# Patient Record
Sex: Female | Born: 1945 | Race: Black or African American | Hispanic: No | Smoking: Former smoker
Health system: Southern US, Community
[De-identification: ages and names within clinical notes are randomized; demographics above are authoritative.]

## PROBLEM LIST (undated history)

## (undated) DIAGNOSIS — M810 Age-related osteoporosis without current pathological fracture: Secondary | ICD-10-CM

## (undated) DIAGNOSIS — R0609 Other forms of dyspnea: Secondary | ICD-10-CM

## (undated) DIAGNOSIS — R911 Solitary pulmonary nodule: Secondary | ICD-10-CM

## (undated) DIAGNOSIS — J45909 Unspecified asthma, uncomplicated: Secondary | ICD-10-CM

## (undated) DIAGNOSIS — I2510 Atherosclerotic heart disease of native coronary artery without angina pectoris: Secondary | ICD-10-CM

## (undated) DIAGNOSIS — R0600 Dyspnea, unspecified: Secondary | ICD-10-CM

## (undated) DIAGNOSIS — J439 Emphysema, unspecified: Secondary | ICD-10-CM

## (undated) DIAGNOSIS — I739 Peripheral vascular disease, unspecified: Secondary | ICD-10-CM

## (undated) DIAGNOSIS — I10 Essential (primary) hypertension: Secondary | ICD-10-CM

## (undated) HISTORY — DX: Atherosclerotic heart disease of native coronary artery without angina pectoris: I25.10

## (undated) HISTORY — DX: Essential (primary) hypertension: I10

## (undated) HISTORY — DX: Solitary pulmonary nodule: R91.1

## (undated) HISTORY — DX: Age-related osteoporosis without current pathological fracture: M81.0

## (undated) HISTORY — DX: Other forms of dyspnea: R06.09

## (undated) HISTORY — DX: Dyspnea, unspecified: R06.00

## (undated) HISTORY — PX: GASTRIC BYPASS: SHX52

## (undated) HISTORY — DX: Emphysema, unspecified: J43.9

## (undated) HISTORY — DX: Peripheral vascular disease, unspecified: I73.9

---

## 2014-10-18 ENCOUNTER — Other Ambulatory Visit: Payer: Self-pay

## 2014-10-18 ENCOUNTER — Other Ambulatory Visit: Payer: Self-pay | Admitting: Family

## 2014-10-18 DIAGNOSIS — E2839 Other primary ovarian failure: Secondary | ICD-10-CM

## 2014-10-18 DIAGNOSIS — Z1231 Encounter for screening mammogram for malignant neoplasm of breast: Secondary | ICD-10-CM

## 2014-10-21 ENCOUNTER — Ambulatory Visit
Admission: RE | Admit: 2014-10-21 | Discharge: 2014-10-21 | Disposition: A | Discharge status: Home / Self Care | Payer: Medicare Other | Source: Ambulatory Visit | Attending: Family | Admitting: Family

## 2014-10-21 DIAGNOSIS — E2839 Other primary ovarian failure: Secondary | ICD-10-CM

## 2014-10-21 DIAGNOSIS — Z1231 Encounter for screening mammogram for malignant neoplasm of breast: Secondary | ICD-10-CM

## 2015-07-12 DIAGNOSIS — R918 Other nonspecific abnormal finding of lung field: Secondary | ICD-10-CM | POA: Insufficient documentation

## 2015-09-01 ENCOUNTER — Encounter: Payer: Self-pay | Admitting: Pulmonary Disease

## 2015-09-04 ENCOUNTER — Other Ambulatory Visit: Payer: Medicare Other

## 2015-09-04 ENCOUNTER — Ambulatory Visit (INDEPENDENT_AMBULATORY_CARE_PROVIDER_SITE_OTHER): Payer: Medicare Other | Admitting: Pulmonary Disease

## 2015-09-04 ENCOUNTER — Encounter: Payer: Self-pay | Admitting: Pulmonary Disease

## 2015-09-04 VITALS — BP 138/86 | HR 70 | Ht 61.5 in | Wt 191.0 lb

## 2015-09-04 DIAGNOSIS — R918 Other nonspecific abnormal finding of lung field: Secondary | ICD-10-CM | POA: Diagnosis not present

## 2015-09-04 DIAGNOSIS — M810 Age-related osteoporosis without current pathological fracture: Secondary | ICD-10-CM | POA: Insufficient documentation

## 2015-09-04 DIAGNOSIS — J449 Chronic obstructive pulmonary disease, unspecified: Secondary | ICD-10-CM

## 2015-09-04 DIAGNOSIS — J432 Centrilobular emphysema: Secondary | ICD-10-CM

## 2015-09-04 DIAGNOSIS — I10 Essential (primary) hypertension: Secondary | ICD-10-CM | POA: Insufficient documentation

## 2015-09-04 NOTE — Patient Instructions (Addendum)
   Continue taking your Anoro inhaler and albuterol inhaler as prescribed.  Please get a copy of your chest CT from 07/12/15 on a CD so that I can review the images myself.  We will repeat your CT scan in June to make sure the spots in your lungs are not getting bigger.  We will do a breathing test at your next appointment in June and review your CT scan at that time.  TESTS ORDERED: 1. CT chest without contrast June 2017 2. Full pulmonary function testing at follow-up appointment 3. Alpha-1 antitrypsin phenotype today central obesity

## 2015-09-04 NOTE — Progress Notes (Signed)
Subjective:    Patient ID: Carla DakinsClaudia Knights, female    DOB: 01/26/1946, 70 y.o.   MRN: [id number]  HPI She was [facility] 04[facility]/15, Urgent Care 06/09/15, & [facility] by [facility] on 2/28 for ongoing problems with "Acute Bronchitis". She was treated with antibiotics previously. She reports initially she had no fever, chills, or sweats. Initially she also had some sinus congestion initially with mild drainage. She reports this has resolved. She reports her cough has largely resolved. She does have wheezing with walking & exertion. She does have dyspnea on exertion but none at rest. She denies any chest tightness or pressure. No adenopathy in her neck, groin, or axilla. No reflux, dyspepsia, or morning brash water taste. Previously patient was on Breo and was recently switched to Anoro. She reports on Anoro she hasn't had to use her rescue inhaler as frequently.   Review of Systems Denies any rashes or easy bruising. She reports some mild joint pain but denies any stiffness, erythema, or swelling. A pertinent 14 point review of systems is negative except as per the history of presenting illness.  No Known Allergies  No current outpatient prescriptions on file prior to visit.   No current facility-administered medications on file prior to visit.    Past Medical History  Diagnosis Date  . Hypertension   . Osteoporosis   . Emphysema of lung [facility](HCC)     Past Surgical History  Procedure Laterality Date  . Gastric bypass      Family History  Problem Relation Age of Onset  . Cancer Mother     leukemia  . Heart attack Father   . Hyperlipidemia Father   . Alcohol abuse Son   . Cancer Paternal Aunt   . Asthma Grandchild     Social History   Social History  . Marital Status: Single    Spouse Name: N/A  . Number of Children: N/A  . Years of Education: N/A   Occupational History  . Retired - currently Regulatory affairs officersubstitue     Teacher Assistant   Social History Main Topics   . Smoking status: Former Smoker -- 0.50 packs/day for 45 years    Types: Cigarettes    Start date: 06/28/1960    Quit date: 04/29/2005  . Smokeless tobacco: Never Used  . Alcohol Use: 0.0 oz/week    0 Standard drinks or equivalent per week     Comment: beer - daily   . Drug Use: No  . Sexual Activity: Not Asked   Other Topics Concern  . None   Social History Narrative   Originally from [place], [place]. Moved to [place] in 2013. 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 No pets currently. No bird exposure. Mold in her current home was treated. Previously enjoyed Research scientist (life sciences)playing bingo.       Objective:   Physical Exam BP 138/86 mmHg  Pulse 70  Ht 5' 1.5" (1.562 m)  Wt 191 lb (86.637 kg)  BMI 35.51 kg/m2  SpO2 97% General:  Awake. Alert. No acute distress. Mild central obesity.  Integument:  Warm & dry. No rash on exposed skin. No bruising. Lymphatics:  No appreciated cervical or supraclavicular lymphadenoapthy. HEENT:  Moist mucus membranes. No oral ulcers. No scleral injection or icterus. Poor dentition. Cardiovascular:  Regular rate. No edema. No appreciable JVD.  Pulmonary:  Good aeration & clear to auscultation bilaterally. Symmetric chest wall expansion. No accessory  muscle use. Abdomen: Soft. Normal bowel sounds. Protuberant. Grossly nontender. Musculoskeletal:  Normal bulk and tone. Hand grip strength 5/5 bilaterally. No joint deformity or effusion appreciated. Neurological:  CN 2-12 grossly in tact. No meningismus. Moving all 4 extremities equally. Symmetric brachioradialis deep tendon reflexes. Psychiatric:  Mood and affect congruent. Speech normal rhythm, rate & tone.   IMAGING CT CHEST W/ 07/12/15 (per radiologist): No axillary, mediastinal, or hilar lymph nodes are enlarged. Heart normal in size. No pleural or pericardial effusion. Bronchial wall thickening of the major airways and cylindrical  bronchiectasis in the lower lobes greater than right middle lobe & lingula. Mild central lobular emphysema. Mild scarring and/or subsegmental atelectasis noted in right middle lobe, lingula, and basilar left lower lobe. Small areas of peripheral nodularity left upper & left lower lobes with tree-in-bud configuration. 9 mm subpleural nodule medial basal right lower lobe. 10 mm nodule superiorly right lower lobe.    Assessment & Plan:  70 year old African female with long-standing history of tobacco use and previously diagnosed with COPD. Her chest CT imaging per radiologist shows bilateral lung nodules with the largest measuring 1 cm in her right lower lobe. Patient also has findings consistent with an suggestive of bronchiectasis. Given her bronchitis and productive cough at the time of imaging I do question whether or not these findings could represent an infectious process. Her cough has since resolved. Certainly an underlying malignancy must be considered and I did communicate this to the patient. I instructed the patient to contact my office if she had any new breathing problems before her next appointment or questions.  1. Bilateral lung nodules: Requested patient obtain chest CT imaging from March on a disc for my review. Repeat CT chest without contrast June 2017. 2. COPD with centrilobular emphysema: Patient continuing on Anoro. Screening for alpha-1 antitrypsin deficiency. Checking full pulmonary function testing at follow-up appointment. 3. Follow-up: Patient to return to clinic in June 2017 or sooner if needed.  Donna Christen Jamison Neighbor, M.D. [facility] Pulmonary & Critical Care Pager:  [phone] After 3pm or if no response, call [phone] 4:17 PM 09/04/2015

## 2015-09-08 LAB — ALPHA-1 ANTITRYPSIN PHENOTYPE: A-1 Antitrypsin: 99 mg/dL (ref 83–199)

## 2015-10-10 ENCOUNTER — Ambulatory Visit (INDEPENDENT_AMBULATORY_CARE_PROVIDER_SITE_OTHER)
Admission: RE | Admit: 2015-10-10 | Discharge: 2015-10-10 | Disposition: A | Discharge status: Home / Self Care | Payer: Medicare Other | Source: Ambulatory Visit | Attending: Pulmonary Disease | Admitting: Pulmonary Disease

## 2015-10-10 DIAGNOSIS — J432 Centrilobular emphysema: Secondary | ICD-10-CM

## 2015-10-10 DIAGNOSIS — J449 Chronic obstructive pulmonary disease, unspecified: Secondary | ICD-10-CM | POA: Diagnosis not present

## 2015-10-10 DIAGNOSIS — R918 Other nonspecific abnormal finding of lung field: Secondary | ICD-10-CM | POA: Diagnosis not present

## 2015-10-16 ENCOUNTER — Encounter: Payer: Self-pay | Admitting: Pulmonary Disease

## 2015-10-16 ENCOUNTER — Ambulatory Visit (HOSPITAL_COMMUNITY)
Admission: RE | Admit: 2015-10-16 | Discharge: 2015-10-16 | Disposition: A | Discharge status: Home / Self Care | Payer: Medicare Other | Source: Ambulatory Visit | Attending: Pulmonary Disease | Admitting: Pulmonary Disease

## 2015-10-16 ENCOUNTER — Ambulatory Visit (INDEPENDENT_AMBULATORY_CARE_PROVIDER_SITE_OTHER): Payer: Medicare Other | Admitting: Pulmonary Disease

## 2015-10-16 ENCOUNTER — Other Ambulatory Visit: Payer: Medicare Other

## 2015-10-16 VITALS — BP 128/82 | HR 65 | Ht 62.0 in | Wt 186.0 lb

## 2015-10-16 DIAGNOSIS — J432 Centrilobular emphysema: Secondary | ICD-10-CM | POA: Diagnosis not present

## 2015-10-16 DIAGNOSIS — J449 Chronic obstructive pulmonary disease, unspecified: Secondary | ICD-10-CM | POA: Insufficient documentation

## 2015-10-16 DIAGNOSIS — R911 Solitary pulmonary nodule: Secondary | ICD-10-CM

## 2015-10-16 DIAGNOSIS — IMO0001 Reserved for inherently not codable concepts without codable children: Secondary | ICD-10-CM

## 2015-10-16 DIAGNOSIS — R918 Other nonspecific abnormal finding of lung field: Secondary | ICD-10-CM | POA: Diagnosis present

## 2015-10-16 LAB — PULMONARY FUNCTION TEST
DL/VA % pred: 103 %
DL/VA: 4.72 ml/min/mmHg/L
DLCO UNC % PRED: 67 %
DLCO unc: 14.45 ml/min/mmHg
FEF 25-75 PRE: 0.34 L/s
FEF 25-75 Post: 0.56 L/sec
FEF2575-%CHANGE-POST: 62 %
FEF2575-%Pred-Post: 37 %
FEF2575-%Pred-Pre: 23 %
FEV1-%CHANGE-POST: 25 %
FEV1-%PRED-POST: 64 %
FEV1-%PRED-PRE: 50 %
FEV1-POST: 1.03 L
FEV1-PRE: 0.82 L
FEV1FVC-%CHANGE-POST: 4 %
FEV1FVC-%Pred-Pre: 66 %
FEV6-%CHANGE-POST: 16 %
FEV6-%PRED-PRE: 76 %
FEV6-%Pred-Post: 89 %
FEV6-PRE: 1.52 L
FEV6-Post: 1.77 L
FEV6FVC-%Change-Post: -3 %
FEV6FVC-%PRED-PRE: 99 %
FEV6FVC-%Pred-Post: 96 %
FVC-%CHANGE-POST: 20 %
FVC-%PRED-POST: 92 %
FVC-%Pred-Pre: 76 %
FVC-PRE: 1.59 L
FVC-Post: 1.92 L
POST FEV1/FVC RATIO: 54 %
POST FEV6/FVC RATIO: 93 %
PRE FEV6/FVC RATIO: 95 %
Pre FEV1/FVC ratio: 51 %
RV % PRED: 135 %
RV: 2.88 L
TLC % pred: 102 %
TLC: 4.87 L

## 2015-10-16 MED ORDER — ALBUTEROL SULFATE (2.5 MG/3ML) 0.083% IN NEBU
2.5000 mg | INHALATION_SOLUTION | Freq: Once | RESPIRATORY_TRACT | Status: AC
  Administered 2015-10-16: 2.5 mg via RESPIRATORY_TRACT

## 2015-10-16 NOTE — Progress Notes (Signed)
Subjective:    Patient ID: Carla Russell, female    DOB: 1945/09/03, 70 y.o.   MRN: [id number]  C.C.:  Follow-up for Bilateral Lung Nodules and Moderate-Severe COPD w/ Emphysema.  HPI Bilateral Lung Nodules:  Dominant nodule in right lower lobe is 1.1cm at maximal dimension. First seen on CT March 2017.  Moderate-Severe COPD w/ Emphysema:  Currently prescribed Anoro. She reports she has taken her Anoro nearly every day. She uses her rescue inhaler 2-3 times monthly. No exacerbations since last appointment. She reports baseline dyspnea. Reports intermittent nonproductive cough. No wheezing.   Review of Systems No chest pain or pressure. No fever, chills, or sweats. No headaches. Does feel unsteady on her feet. No focal weakness, numbness or tingling.   No Known Allergies  Current Outpatient Prescriptions on File Prior to Visit  Medication Sig Dispense Refill  . albuterol (PROVENTIL HFA;VENTOLIN HFA) 108 (90 Base) MCG/ACT inhaler Inhale 2 puffs into the lungs every 4 (four) hours as needed.    Marland Kitchen albuterol (PROVENTIL) (2.5 MG/3ML) 0.083% nebulizer solution Inhale 2.5 mg into the lungs every 6 (six) hours as needed.    Marland Kitchen alendronate (FOSAMAX) 70 MG tablet Take 70 mg by mouth every 7 (seven) days.    . Multiple Vitamin (MULTIVITAMIN) tablet Take 1 tablet by mouth daily.    Marland Kitchen umeclidinium-vilanterol (ANORO ELLIPTA) 62.5-25 MCG/INH AEPB Inhale 1 puff into the lungs daily.     No current facility-administered medications on file prior to visit.    Past Medical History  Diagnosis Date  . Hypertension   . Osteoporosis   . Emphysema of lung [facility])     Past Surgical History  Procedure Laterality Date  . Gastric bypass      Family History  Problem Relation Age of Onset  . Cancer Mother     leukemia  . Heart attack Father   . Hyperlipidemia Father   . Alcohol abuse Son   . Cancer Paternal Aunt   . Asthma Grandchild     Social History   Social History  . Marital Status: Single    Spouse Name: N/A  . Number of Children: N/A  . Years of Education: N/A   Occupational History  . Retired - currently Regulatory affairs officer   Social History Main Topics  . Smoking status: Former Smoker -- 0.50 packs/day for 45 years    Types: Cigarettes    Start date: 06/28/1960    Quit date: 04/29/2005  . Smokeless tobacco: Never Used  . Alcohol Use: 0.0 oz/week    0 Standard drinks or equivalent per week     Comment: beer - daily   . Drug Use: No  . Sexual Activity: Not Asked   Other Topics Concern  . None   Social History Narrative   Originally from [place], [place]. Moved to [place] in 2013. 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 No pets currently. No bird exposure. Mold in her current home was treated. Previously enjoyed Research scientist (life sciences).       Objective:   Physical Exam BP 128/82 mmHg  Pulse 65  Ht  (1.575 m)  Wt 186 lb (84.369 kg)  BMI 34.01 kg/m2  SpO2 96% General:  Awake. Alert. No distress.   Integument:  Warm & dry. No rash on exposed skin. No bruising. Lymphatics:  No appreciated cervical or supraclavicular lymphadenoapthy. HEENT:  Moist mucus membranes. Poor dentition. No scleral  icterus. Cardiovascular:  Regular rate. No edema. Normal S1 & S2.  Pulmonary:  Clear bilaterally to auscultation. Normal work of breathing on room air. Speaking in complete sentences.  PFT 10/16/15: FVC 1.59 L (76%) FEV1 0.18 L (80%) FEV1/FVC 0.51 FEF 25-75 0.34 L (23%) positive bronchodilator response TLC 4.87 L (102%) RV 135% DLCO uncorrected 67%  IMAGING CT CHEST W/O 10/10/15 (personally reviewed by me): No pathologic mediastinal adenopathy. No pericardial effusion. No pleural effusion or thickening. 1.1 x 0.9 cm right lower lobe nodule relatively unchanged. No other new or developing nodules appreciated. The goal predominant emphysematous changes. Suggestion of bronchiectasis in lower  lobes.  CT CHEST W/ 07/12/15 (reviewed by me): No axillary, mediastinal, or hilar lymph nodes are enlarged. Heart normal in size. No pleural or pericardial effusion. Bronchial wall thickening of the major airways and cylindrical bronchiectasis in the lower lobes greater than right middle lobe & lingula. Mild central lobular emphysema. Mild scarring and/or subsegmental atelectasis noted in right middle lobe, lingula, and basilar left lower lobe. Small areas of peripheral nodularity left upper & left lower lobes with tree-in-bud configuration. 9 mm subpleural nodule medial basal right lower lobe. 10 mm nodule superiorly right lower lobe.  LABS 09/04/15 Alpha-1 antitrypsin: MM (99)    Assessment & Plan:  70 year old African female with underlying moderate-severe COPD with centrilobular emphysema. Suspect patient COPD is secondary to her prior tobacco use given negative alpha-1 antitrypsin. I do believe that she would benefit from daily use of her inhaled medications and did counsel her as such. I will hold off initiating any new inhaler medications at this time pending repeat pulmonary function testing at next appointment. We did spend a significant amount time today discussing her CT scan and comparing it to her previous CT scan. The nodule has not changed appreciably in size but given the potential risk of malignancy I feel that PET/CT imaging is reasonable. With resolution of previous nodules noted on imaging I do question whether or not this could represent an inflammatory/autoimmune process. I am performing a serum and urine workup for possible infectious and inflammatory etiologies. Instructed the patient to contact my office if she had any new breathing problems before next appointment.  1. Bilateral Lung Nodules/Right Lower Lobe Nodule: Checking PET CT scan. Checking serum ANCA panel, ANA with reflex to comprehensive panel, anti-CCP, SSA, & SSB. 2. Moderate-Severe COPD w/ Emphysema: Continuing patient  on Anoro. Repeat spirometry with bronchodilator challenge and next appointment to ensure maximal bronchodilatation. 6 minute walk test at next appointment. 3. Immunizations: Influenza vaccine October 2016. Patient to determine date of last Pneumovax with her primary care physician's office. 4. Follow-up: Patient to return to clinic in 3 months or sooner if needed.  Donna ChristenJennings E. Jamison NeighborNestor, M.D. [facility] Pulmonary & Critical Care Pager:  [phone] After 3pm or if no response, call [phone] 4:46 PM 10/16/2015

## 2015-10-16 NOTE — Patient Instructions (Addendum)
   We will set you up for a PET CT scan and decide on biopsy after that result is back.  Remember to take your Anoro daily.  Call me if you have any new breathing problems before your next appointment.  I will see you back in 3 months with a breathing test at that time.  TESTS ORDERED: 1. PET CT scan 2. Spirometry with bronchodilator challenge and next appointment 3. 6 minute walk test on room air at next appointment 4. Serum ANCA Panel, ANA with reflex to comprehensive panel, Anti-CCP, SSA, & SSB.  5. Urine Histoplasma & Blastomyces Antigens

## 2015-10-17 LAB — ANTINUCLEAR ANTIBODIES, IFA: ANA TITER 1: NEGATIVE

## 2015-10-17 LAB — SJOGREN'S SYNDROME ANTIBODS(SSA + SSB)
SSA (Ro) (ENA) Antibody, IgG: <1
SSB (LA) (ENA) ANTIBODY, IGG: NEGATIVE

## 2015-10-18 LAB — ANCA SCREEN W REFLEX TITER: ANCA SCREEN: POSITIVE — AB

## 2015-10-18 LAB — RFLX P-ANCA TITER

## 2015-10-18 LAB — CYCLIC CITRUL PEPTIDE ANTIBODY, IGG

## 2015-10-20 LAB — HISTOPLASMA ANTIGEN, URINE

## 2015-10-20 LAB — MVISTA BLASTOMYCES QNT AG, URINE

## 2015-10-24 ENCOUNTER — Ambulatory Visit (HOSPITAL_COMMUNITY)
Admission: RE | Admit: 2015-10-24 | Discharge: 2015-10-24 | Disposition: A | Discharge status: Home / Self Care | Payer: Medicare Other | Source: Ambulatory Visit | Attending: Pulmonary Disease | Admitting: Pulmonary Disease

## 2015-10-24 DIAGNOSIS — R911 Solitary pulmonary nodule: Secondary | ICD-10-CM | POA: Insufficient documentation

## 2015-10-24 DIAGNOSIS — I700 Atherosclerosis of aorta: Secondary | ICD-10-CM | POA: Diagnosis not present

## 2015-10-24 DIAGNOSIS — IMO0001 Reserved for inherently not codable concepts without codable children: Secondary | ICD-10-CM

## 2015-10-24 DIAGNOSIS — I2510 Atherosclerotic heart disease of native coronary artery without angina pectoris: Secondary | ICD-10-CM | POA: Insufficient documentation

## 2015-10-24 LAB — GLUCOSE, CAPILLARY: Glucose-Capillary: 94 mg/dL (ref 65–99)

## 2015-10-24 MED ORDER — FLUDEOXYGLUCOSE F - 18 (FDG) INJECTION: 9.4000 | Freq: Once | INTRAVENOUS | Status: DC | PRN

## 2015-10-25 ENCOUNTER — Telehealth: Payer: Self-pay | Admitting: *Deleted

## 2015-10-25 DIAGNOSIS — R918 Other nonspecific abnormal finding of lung field: Secondary | ICD-10-CM

## 2015-10-25 NOTE — Telephone Encounter (Signed)
CT ordered. Patient aware. Nothing further needed.

## 2015-10-25 NOTE — Telephone Encounter (Signed)
-----   Message from Roslynn AmbleJennings E Nestor, MD sent at 10/24/2015  6:29 PM EDT ----- please call the patient and let her know that her PET CT scan did not show any hypermetabolic areas that would suggest malignancy.  Particularly the nodule in her right lower lobe was not active. This does not completely rule out the possibility of cancer but is reassuring. We will plan to repeat her chest CT scan without contrast in September 2017 to continue to follow and make sure there is no growth. Please order this. Thank you.

## 2015-10-26 ENCOUNTER — Other Ambulatory Visit: Payer: Self-pay | Admitting: Family

## 2015-10-26 DIAGNOSIS — Z1231 Encounter for screening mammogram for malignant neoplasm of breast: Secondary | ICD-10-CM

## 2015-10-30 ENCOUNTER — Ambulatory Visit
Admission: RE | Admit: 2015-10-30 | Discharge: 2015-10-30 | Disposition: A | Discharge status: Home / Self Care | Payer: Medicare Other | Source: Ambulatory Visit | Attending: Family | Admitting: Family

## 2015-10-30 DIAGNOSIS — Z1231 Encounter for screening mammogram for malignant neoplasm of breast: Secondary | ICD-10-CM

## 2015-11-03 ENCOUNTER — Telehealth: Payer: Self-pay | Admitting: Pulmonary Disease

## 2015-11-03 NOTE — Telephone Encounter (Signed)
Result Notes     Notes Recorded by Karleen HampshireMichelle P Gay, CMA on 10/25/2015 at 4:27 PM Patient notified. CT ordered prior to 9/20 appointment in September. Nothing further needed. ------ Notes Recorded by Roslynn AmbleJennings E Nestor, MD on 10/24/2015 at 6:29 PM please call the patient and let her know that her PET CT scan did not show any hypermetabolic areas that would suggest malignancy. Particularly the nodule in her right lower lobe was not active. This does not completely rule out the possibility of cancer but is reassuring. We will plan to repeat her chest CT scan without contrast in September 2017 to continue to follow and make sure there is no growth. Please order this. Thank you.   Spoke with pt. She did not remember talking to Littleton CommonMichelle on 10/25/15. I have gone over her PET scan results with her again. All of her questions have been answered.

## 2016-01-10 ENCOUNTER — Inpatient Hospital Stay: Admission: RE | Admit: 2016-01-10 | Payer: Medicare Other | Source: Ambulatory Visit

## 2016-01-16 ENCOUNTER — Ambulatory Visit: Payer: BLUE CROSS/BLUE SHIELD | Admitting: Pulmonary Disease

## 2016-01-16 ENCOUNTER — Ambulatory Visit: Payer: BLUE CROSS/BLUE SHIELD

## 2016-01-17 ENCOUNTER — Ambulatory Visit (INDEPENDENT_AMBULATORY_CARE_PROVIDER_SITE_OTHER): Payer: Medicare Other | Admitting: Pulmonary Disease

## 2016-01-17 ENCOUNTER — Other Ambulatory Visit: Payer: Self-pay | Admitting: Pulmonary Disease

## 2016-01-17 ENCOUNTER — Encounter: Payer: Self-pay | Admitting: Pulmonary Disease

## 2016-01-17 VITALS — BP 140/82 | HR 75 | Ht 62.0 in | Wt 192.0 lb

## 2016-01-17 DIAGNOSIS — R911 Solitary pulmonary nodule: Secondary | ICD-10-CM

## 2016-01-17 DIAGNOSIS — J449 Chronic obstructive pulmonary disease, unspecified: Secondary | ICD-10-CM

## 2016-01-17 DIAGNOSIS — Z23 Encounter for immunization: Secondary | ICD-10-CM | POA: Diagnosis not present

## 2016-01-17 DIAGNOSIS — IMO0001 Reserved for inherently not codable concepts without codable children: Secondary | ICD-10-CM

## 2016-01-17 DIAGNOSIS — J432 Centrilobular emphysema: Secondary | ICD-10-CM

## 2016-01-17 LAB — PULMONARY FUNCTION TEST
FEF 25-75 Post: 0.4 L/sec
FEF 25-75 Pre: 0.37 L/sec
FEF2575-%CHANGE-POST: 8 %
FEF2575-%Pred-Post: 27 %
FEF2575-%Pred-Pre: 25 %
FEV1-%Change-Post: 3 %
FEV1-%Pred-Post: 65 %
FEV1-%Pred-Pre: 63 %
FEV1-PRE: 1.01 L
FEV1-Post: 1.05 L
FEV1FVC-%CHANGE-POST: 4 %
FEV1FVC-%Pred-Pre: 68 %
FEV6-%Change-Post: 1 %
FEV6-%PRED-PRE: 90 %
FEV6-%Pred-Post: 92 %
FEV6-PRE: 1.78 L
FEV6-Post: 1.81 L
FEV6FVC-%Change-Post: 2 %
FEV6FVC-%PRED-PRE: 96 %
FEV6FVC-%Pred-Post: 99 %
FVC-%CHANGE-POST: 0 %
FVC-%PRED-PRE: 93 %
FVC-%Pred-Post: 92 %
FVC-POST: 1.9 L
FVC-PRE: 1.92 L
POST FEV6/FVC RATIO: 95 %
PRE FEV6/FVC RATIO: 93 %
Post FEV1/FVC ratio: 55 %
Pre FEV1/FVC ratio: 53 %

## 2016-01-17 MED ORDER — ALBUTEROL SULFATE HFA 108 (90 BASE) MCG/ACT IN AERS: 2.0000 | INHALATION_SPRAY | RESPIRATORY_TRACT | 3 refills | Status: DC | PRN

## 2016-01-17 MED ORDER — UMECLIDINIUM-VILANTEROL 62.5-25 MCG/INH IN AEPB: 1.0000 | INHALATION_SPRAY | Freq: Every day | RESPIRATORY_TRACT | 3 refills | Status: DC

## 2016-01-17 NOTE — Progress Notes (Signed)
Test reviewed.  

## 2016-01-17 NOTE — Patient Instructions (Addendum)
   Continue using your inhalers as prescribed.  Call me if you have any new breathing problems before your next appointment.  I will see you back in 6 months or sooner if needed.   TESTS ORDERED: 1. CT Chest w/o Contrast this month (September) - [place]ers [address]

## 2016-01-17 NOTE — Progress Notes (Signed)
Subjective:    Patient ID: Carla Russell, female    DOB: 03/28/1946, 70 y.o.   MRN: [id number]  C.C.:  Follow-up for Bilateral Lung Nodules/Right Lower Lobe Nodule and Moderate-Severe COPD w/ Emphysema.  HPI Bilateral Lung Nodules/Right Lower Lobe Nodule:  Dominant nodule in right lower lobe is 1.1cm at maximal dimension. First seen on CT March 2017. Lung nodule was not hypermetabolic on PET CT imaging in June. Patient was supposed to have repeat CT imaging this month but declined.  Moderate-Severe COPD w/ Emphysema:  On Anoro. She reports she is using her rescue inhaler 3-4 times per month. Minimal coughing or wheezing. She reports no worsening in her baseline dyspnea. No exacerbations since last appointment.   Review of Systems No chest pain, tightness, or pressure. No fever, chills, or sweats. No nausea, emesis, or diarrhea.   No Known Allergies  Current Outpatient Prescriptions on File Prior to Visit  Medication Sig Dispense Refill  . albuterol (PROVENTIL HFA;VENTOLIN HFA) 108 (90 Base) MCG/ACT inhaler Inhale 2 puffs into the lungs every 4 (four) hours as needed.    Marland Kitchen. albuterol (PROVENTIL) (2.5 MG/3ML) 0.083% nebulizer solution Inhale 2.5 mg into the lungs every 6 (six) hours as needed.    Marland Kitchen. alendronate (FOSAMAX) 70 MG tablet Take 70 mg by mouth every 7 (seven) days.    . Multiple Vitamin (MULTIVITAMIN) tablet Take 1 tablet by mouth daily.    Marland Kitchen. umeclidinium-vilanterol (ANORO ELLIPTA) 62.5-25 MCG/INH AEPB Inhale 1 puff into the lungs daily.     No current facility-administered medications on file prior to visit.     Past Medical History:  Diagnosis Date  . Emphysema of lung (HCC)   . Hypertension   . Osteoporosis     Past Surgical History:  Procedure Laterality Date  . GASTRIC BYPASS      Family History  Problem Relation Age of Onset  . Cancer Mother     leukemia  . Heart attack Father   . Hyperlipidemia Father   . Alcohol abuse Son   . Cancer Paternal Aunt   .  Asthma Grandchild     Social History   Social History  . Marital status: Single    Spouse name: N/A  . Number of children: N/A  . Years of education: N/A   Occupational History  . Retired - currently Regulatory affairs officersubstitue     Teacher Assistant   Social History Main Topics  . Smoking status: Former Smoker    Packs/day: 0.50    Years: 45.00    Types: Cigarettes    Start date: 06/28/1960    Quit date: 04/29/2005  . Smokeless tobacco: Never Used  . Alcohol use 0.0 oz/week     Comment: beer - daily   . Drug use: No  . Sexual activity: Not Asked   Other Topics Concern  . None   Social History Narrative   Originally from [place], [place]. Moved to [place] in 2013. 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 No pets currently. No bird exposure. Mold in her current home was treated. Previously enjoyed Research scientist (life sciences)playing bingo.       Objective:   Physical Exam BP 140/82 (BP Location: Left Arm, Cuff Size: Normal)   Pulse 75   Ht 5\' 2"  (1.575 m)   Wt 192 lb (87.1 kg)   SpO2 95%   BMI 35.12 kg/m  General:  Awake. Alert. No distress. Comfortable.  Integument:  Warm & dry.  No rash on exposed skin. No bruising. Lymphatics:  No appreciated cervical or supraclavicular lymphadenoapthy. HEENT:  Moist mucus membranes. Poor dentition. No scleral icterus. No nasal turbinate swelling. Cardiovascular:  Regular rate & rhythm. No edema. Normal S1 & S2.  Pulmonary:  Good aeration bilaterally. No accessory muscle use on room air. Clear to auscultation.  PFT 01/17/16: FVC 1.92 L (93%) FEV1 1.01 L (63%) FEV1/FVC 0.53 FEF 25-75 0.37 L (25%) negative bronchodilator response 10/16/15: FVC 1.59 L (76%) FEV1 0.82 L (50%) FEV1/FVC 0.51 FEF 25-75 0.34 L (23%) positive bronchodilator response TLC 4.87 L (102%) RV 135% DLCO uncorrected 67%  01/17/16:  Walked 336 meters / Baseline Sat 100% on RA / Nadir Sat 91% on RA  IMAGING PET CT 10/24/15  (previously reviewed by me): No abnormal uptake within 1 cm right lower lobe nodule. No hypermetabolic areas elsewhere in the body either.  CT CHEST W/O 10/10/15 (previously reviewed by me): No pathologic mediastinal adenopathy. No pericardial effusion. No pleural effusion or thickening. 1.1 x 0.9 cm right lower lobe nodule relatively unchanged. No other new or developing nodules appreciated. The goal predominant emphysematous changes. Suggestion of bronchiectasis in lower lobes.  CT CHEST W/ 07/12/15 (previously reviewed by me): No axillary, mediastinal, or hilar lymph nodes are enlarged. Heart normal in size. No pleural or pericardial effusion. Bronchial wall thickening of the major airways and cylindrical bronchiectasis in the lower lobes greater than right middle lobe & lingula. Mild central lobular emphysema. Mild scarring and/or subsegmental atelectasis noted in right middle lobe, lingula, and basilar left lower lobe. Small areas of peripheral nodularity left upper & left lower lobes with tree-in-bud configuration. 9 mm subpleural nodule medial basal right lower lobe. 10 mm nodule superiorly right lower lobe.  LABS 10/16/15 ANA:  Negative Anti-CCP:  <16 SSA:  <1.0 SSB:  <1.0 Urine Histoplasma Antigen:  <0.5 Urine Blastomyces Antigen:  Negative  P-ANCA:  1:40  09/04/15 Alpha-1 antitrypsin: MM (99)    Assessment & Plan:  70 y.o. African female with underlying moderate-severe COPD with centrilobular emphysema and bilateral lung nodules as well as a dominant right lower lobe nodule. Right lower lobe nodule was not hypermetabolic on PET/CT imaging and I reviewed her serum lab tests with her from last appointment. Her spirometry today has significantly improved since previous testing and demonstrates no significant bronchodilator response. I did review her 6 minute walk test which shows a significant desaturation but not enough to require oxygen therapy. I instructed the patient to contact my office  if she had any new breathing problems or questions before her next appointment.  1. Bilateral Lung Nodules/Right Lower Lobe Nodule:  Repeat CT scan this month. If stable plan for repeat CT scan in 3 months. 2. Moderate-Severe COPD w/ Emphysema:  Continuing Anoro. Refills sent today. 3. Health Maintenance:  S/P Prevnar 11 June 2013 & Pneumovax 19 November 2011.  4. Follow-up: Patient to return to clinic in 6 months or sooner if needed.   Donna Christen Jamison Neighbor, M.D. [facility] Pulmonary & Critical Care Pager:  [phone] After 3pm or if no response, call [phone] 12:06 PM 01/17/16

## 2016-01-17 NOTE — Addendum Note (Signed)
Addended by: Velvet BatheAULFIELD, Joene Gelder L on: 01/17/2016 12:33 PM   Modules accepted: Orders

## 2016-01-25 ENCOUNTER — Ambulatory Visit (INDEPENDENT_AMBULATORY_CARE_PROVIDER_SITE_OTHER)
Admission: RE | Admit: 2016-01-25 | Discharge: 2016-01-25 | Disposition: A | Discharge status: Home / Self Care | Payer: Medicare Other | Source: Ambulatory Visit | Attending: Pulmonary Disease | Admitting: Pulmonary Disease

## 2016-01-25 DIAGNOSIS — R911 Solitary pulmonary nodule: Secondary | ICD-10-CM | POA: Diagnosis not present

## 2016-01-25 DIAGNOSIS — IMO0001 Reserved for inherently not codable concepts without codable children: Secondary | ICD-10-CM

## 2016-02-01 ENCOUNTER — Telehealth: Payer: Self-pay | Admitting: Pulmonary Disease

## 2016-02-01 DIAGNOSIS — R918 Other nonspecific abnormal finding of lung field: Secondary | ICD-10-CM

## 2016-02-01 NOTE — Telephone Encounter (Signed)
Notes Recorded by Roslynn AmbleJennings E Nestor, MD on 01/30/2016 at 6:27 PM EDT Please let the patient know I reviewed her CT scan. The nodule in her right lower lung hasn't changed in size when compared with her CT scan in March. I feel it is reasonable to repeat her CT scan in March 2018 to continue to monitor it for signs of progression/enlargement. I would not wait any longer than 6 months and at the earliest would do a repeat scan at 3 months, but I think 6 months should be sufficient. Please order a CT scan of her chest without contrast for March 2018. Thanks.  Pt aware CT results. Pt voiced understanding. Order placed for CT. Nothing further needed.

## 2016-06-10 ENCOUNTER — Telehealth: Payer: Self-pay | Admitting: Pulmonary Disease

## 2016-06-10 MED ORDER — UMECLIDINIUM-VILANTEROL 62.5-25 MCG/INH IN AEPB: 1.0000 | INHALATION_SPRAY | Freq: Every day | RESPIRATORY_TRACT | 3 refills | Status: DC

## 2016-06-10 NOTE — Telephone Encounter (Signed)
Rx sent to preferred pharmacy. Pt aware & voiced her understanding.  Nothing further needed.  

## 2016-07-17 ENCOUNTER — Telehealth: Payer: Self-pay | Admitting: Pulmonary Disease

## 2016-07-17 NOTE — Telephone Encounter (Signed)
The patient's CT scan isn't until 3/28. Once it's available I will review it. J.

## 2016-07-17 NOTE — Telephone Encounter (Signed)
Pt aware that once she has her CT scan we will call her with the results of this and she will not have wait until her May appt. Nothing further needed.

## 2016-07-17 NOTE — Telephone Encounter (Signed)
Called and spoke to pt. Pt states we are needing to reschedule 08/06/16 d/t JN needing to change office hours. Pt was rescheduled to 08/2016. Pt is requesting her CT chest from 3/28 to be read and resulted prior to May appt.   Dr. Jamison NeighborNestor please advise on CT chest results on 3/28. Thanks.

## 2016-07-24 ENCOUNTER — Ambulatory Visit (INDEPENDENT_AMBULATORY_CARE_PROVIDER_SITE_OTHER)
Admission: RE | Admit: 2016-07-24 | Discharge: 2016-07-24 | Disposition: A | Discharge status: Home / Self Care | Payer: Medicare Other | Source: Ambulatory Visit | Attending: Pulmonary Disease | Admitting: Pulmonary Disease

## 2016-07-24 DIAGNOSIS — R918 Other nonspecific abnormal finding of lung field: Secondary | ICD-10-CM

## 2016-07-25 ENCOUNTER — Ambulatory Visit: Payer: Medicare Other | Admitting: Pulmonary Disease

## 2016-07-31 ENCOUNTER — Telehealth: Payer: Self-pay | Admitting: Pulmonary Disease

## 2016-07-31 NOTE — Telephone Encounter (Signed)
Pt requesting CT chest results from 07/24/16.   JN please advise.  Thanks!

## 2016-08-01 NOTE — Telephone Encounter (Signed)
Patient returned phone call.Carla Russell # [phone] after 3pm..Erica Deno Etienne

## 2016-08-01 NOTE — Telephone Encounter (Signed)
Called and spoke with pts daughter and she took the message for the pt and will make her aware.

## 2016-08-01 NOTE — Telephone Encounter (Signed)
Called home number- NA and no VM   LMTCB on Cell

## 2016-08-01 NOTE — Telephone Encounter (Signed)
Pt called back and she is also aware of JN recs.  Nothing further is needed.

## 2016-08-01 NOTE — Telephone Encounter (Signed)
Please let her know that the spot hasn't changed in size. We will review it further at her appointment in May. Thanks.

## 2016-08-06 ENCOUNTER — Ambulatory Visit: Payer: Medicare Other | Admitting: Pulmonary Disease

## 2016-08-09 NOTE — Progress Notes (Signed)
Left message to contact office

## 2016-08-15 NOTE — Progress Notes (Signed)
Spoke with patient and informed her of results. Pt was reminded of upcoming appointment on 09/11/16. Pt verbalized understanding and did not have any questions. Nothing further is needed.

## 2016-09-11 ENCOUNTER — Encounter: Payer: Self-pay | Admitting: Pulmonary Disease

## 2016-09-11 ENCOUNTER — Ambulatory Visit (INDEPENDENT_AMBULATORY_CARE_PROVIDER_SITE_OTHER): Payer: Medicare Other | Admitting: Pulmonary Disease

## 2016-09-11 VITALS — BP 140/80 | HR 65 | Ht 62.0 in | Wt 190.8 lb

## 2016-09-11 DIAGNOSIS — R911 Solitary pulmonary nodule: Secondary | ICD-10-CM

## 2016-09-11 DIAGNOSIS — J449 Chronic obstructive pulmonary disease, unspecified: Secondary | ICD-10-CM | POA: Diagnosis not present

## 2016-09-11 DIAGNOSIS — J3089 Other allergic rhinitis: Secondary | ICD-10-CM | POA: Diagnosis not present

## 2016-09-11 DIAGNOSIS — J302 Other seasonal allergic rhinitis: Secondary | ICD-10-CM | POA: Insufficient documentation

## 2016-09-11 DIAGNOSIS — IMO0001 Reserved for inherently not codable concepts without codable children: Secondary | ICD-10-CM

## 2016-09-11 MED ORDER — UMECLIDINIUM-VILANTEROL 62.5-25 MCG/INH IN AEPB: 1.0000 | INHALATION_SPRAY | Freq: Every day | RESPIRATORY_TRACT | 3 refills | Status: DC

## 2016-09-11 NOTE — Progress Notes (Signed)
Subjective:    Patient ID: Carla DakinsClaudia Russell, female    DOB: 04/06/1946, 71 y.o.   MRN: [id number]  C.C.:  Follow-up for Moderate-Severe COPD w/ Emphysema & Bilateral Lung Nodules/Dominant Right Lower Lobe Nodule.  HPI Moderate-severe COPD with emphysema: Currently prescribed Anoro. She reports her breathing is "ok". She does notice increased dyspnea with increased humidity or dampness. She still has her baseline dyspnea on exertion. Denies any wheezing. No exacerbations since last appointment. She is using her rescue medication sporadically depending on the weather.   Bilateral lung nodules/dominant right lower lobe nodule: Dominant right lower lobe nodule measuring 1.1 cm in maximal dimension was first seen on CT imaging March 2017 nodule was not hypermetabolic on PET CT scan.   Review of Systems She reports she feels like she is swallowing "mucus' at times. She isn't sure if she is having any increased sinus congestion or pressure. No chest pain or pressure. No fever or chills.   No Known Allergies  Current Outpatient Prescriptions on File Prior to Visit  Medication Sig Dispense Refill  . albuterol (PROVENTIL HFA;VENTOLIN HFA) 108 (90 Base) MCG/ACT inhaler Inhale 2 puffs into the lungs every 4 (four) hours as needed. 3 Inhaler 3  . alendronate (FOSAMAX) 70 MG tablet Take 70 mg by mouth every 7 (seven) days.    . Multiple Vitamin (MULTIVITAMIN) tablet Take 1 tablet by mouth daily.    Marland Kitchen. umeclidinium-vilanterol (ANORO ELLIPTA) 62.5-25 MCG/INH AEPB Inhale 1 puff into the lungs daily. 180 each 3  . albuterol (PROVENTIL) (2.5 MG/3ML) 0.083% nebulizer solution Inhale 2.5 mg into the lungs every 6 (six) hours as needed.     No current facility-administered medications on file prior to visit.     Past Medical History:  Diagnosis Date  . Emphysema of lung (HCC)   . Hypertension   . Osteoporosis     Past Surgical History:  Procedure Laterality Date  . GASTRIC BYPASS      Family History    Problem Relation Age of Onset  . Cancer Mother        leukemia  . Heart attack Father   . Hyperlipidemia Father   . Alcohol abuse Son   . Cancer Paternal Aunt   . Asthma Grandchild     Social History   Social History  . Marital status: Single    Spouse name: N/A  . Number of children: N/A  . Years of education: N/A   Occupational History  . Retired - currently Regulatory affairs officersubstitue     Teacher Assistant   Social History Main Topics  . Smoking status: Former Smoker    Packs/day: 0.50    Years: 45.00    Types: Cigarettes    Start date: 06/28/1960    Quit date: 04/29/2005  . Smokeless tobacco: Never Used  . Alcohol use 0.0 oz/week     Comment: beer - daily   . Drug use: No  . Sexual activity: Not Asked   Other Topics Concern  . None   Social History Narrative   Originally from [place], [place]. Moved to [place] in 2013. 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 No pets currently. No bird exposure. Mold in her current home was treated. Previously enjoyed Research scientist (life sciences)playing bingo.       Objective:   Physical Exam BP 140/80 (BP Location: Right Arm, Patient Position: Sitting, Cuff Size: Normal)   Pulse 65   Ht 5\' 2"  (1.575 m)  Wt 190 lb 12.8 oz (86.5 kg)   SpO2 95%   BMI 34.90 kg/m   Gen.: Elderly female. No distress. Alert. HEENT: Poor dentition. No oral ulcers. Mild bilateral nasal turbinate swelling. Integument: Warm. Dry. No rash. Pulmonary: Clear to auscultation bilaterally with good aeration. Normal work of breathing on room air. Cardiovascular: Radial rate. Regular rhythm. No edema. Neurological: Cranial nerves grossly intact. No meningismus. Oriented 4. Extremities: No cyanosis.  PFT 01/17/16: FVC 1.92 L (93%) FEV1 1.01 L (63%) FEV1/FVC 0.53 FEF 25-75 0.37 L (25%) negative bronchodilator response 10/16/15: FVC 1.59 L (76%) FEV1 0.82 L (50%) FEV1/FVC 0.51 FEF 25-75 0.34 L (23%) positive bronchodilator  response TLC 4.87 L (102%) RV 135% DLCO uncorrected 67%  01/17/16:  Walked 336 meters / Baseline Sat 100% on RA / Nadir Sat 91% on RA  IMAGING CT CHEST W/O 07/24/16 (personally reviewed by me): No pleural effusion or thickening. No pericardial effusion. No pathologic mediastinal adenopathy. Apical predominant centrilobular emphysema noted again. Right lower lobe nodule is relatively unchanged in size of maximal dimension 1 cm & continued somewhat globular appearance. No other developing nodule or opacity appreciated.  PET CT 10/24/15 (previously reviewed by me): No abnormal uptake within 1 cm right lower lobe nodule. No hypermetabolic areas elsewhere in the body either.  CT CHEST W/O 10/10/15 (previously reviewed by me): No pathologic mediastinal adenopathy. No pericardial effusion. No pleural effusion or thickening. 1.1 x 0.9 cm right lower lobe nodule relatively unchanged. No other new or developing nodules appreciated. The goal predominant emphysematous changes. Suggestion of bronchiectasis in lower lobes.  CT CHEST W/ 07/12/15 (previously reviewed by me): No axillary, mediastinal, or hilar lymph nodes are enlarged. Heart normal in size. No pleural or pericardial effusion. Bronchial wall thickening of the major airways and cylindrical bronchiectasis in the lower lobes greater than right middle lobe & lingula. Mild central lobular emphysema. Mild scarring and/or subsegmental atelectasis noted in right middle lobe, lingula, and basilar left lower lobe. Small areas of peripheral nodularity left upper & left lower lobes with tree-in-bud configuration. 9 mm subpleural nodule medial basal right lower lobe. 10 mm nodule superiorly right lower lobe.  LABS 10/16/15 ANA:  Negative Anti-CCP:  <16 SSA:  <1.0 SSB:  <1.0 Urine Histoplasma Antigen:  <0.5 Urine Blastomyces Antigen:  Negative  P-ANCA:  1:40  09/04/15 Alpha-1 antitrypsin: MM (99)    Assessment & Plan:  71 y.o. female with moderate-severe  COPD and bilateral lung nodules. Patient noted to have a dominant right lower lobe nodule as well. Symptomatically the patient's COPD seems to be very well controlled with her current inhaler regimen. Reviewed her chest CT with her today which shows no change in the morphology or appreciable change in size with regards to her right lower lobe nodule. We did discuss current guideline recommendations which indicate that a repeat CT chest in one year is standard. However, given the patient's history of tobacco use and family history of malignancy we did discuss repeating a CT scan of the chest sooner-6 months from her previous CT scan:  September 2018. At this time the patient is comfortable with repeating her CT scan at the year mark. If she changes her mind she will notify me and we will adjust our timing. I instructed her to notify me if she had any new breathing problems or questions before her next appointment.  1. Moderate-severe COPD with emphysema:  Well-controlled with Anoro. No changes. Continuing rescue medication as needed. 2. Bilateral lung nodules/dominant right  lower lobe nodule: No change on repeat CT imaging in March (one year mark). Plan for repeat CT chest in March 2019. Patient will notify me if she wishes to perform the test sooner. 3. Seasonal allergic rhinitis: Recommended nasal saline rinse 1-2 times daily. Also advised her she could try over-the-counter antihistamine therapy or intranasal corticosteroid therapy. 4. Health maintenance: Status post influenza vaccine September 2017, Prevnar 11 June 2013 & Pneumovax 19 November 2011.  5. Follow-up: Return to clinic in 6 months or sooner if needed.  Donna Christen Jamison Neighbor, M.D. [facility] Pulmonary & Critical Care Pager:  [phone] After 3pm or if no response, call [phone] 4:31 PM 09/11/16

## 2016-09-11 NOTE — Patient Instructions (Addendum)
   Call me if you have any new breathing problems before your next appointment.  We are going to continue you on Anoro as prescribed.  I will see you back in 6 months or sooner if needed.   Try using the sinus rinse we gave you today 1-2 times daily to help with your sinus drainage. You can buy this or one like it over the counter.  If you still have drainage from your sinuses then you can try over-the-counter Claritin, Allegra, Zyrtec, or even a nasal spray like Flonase.   TESTS ORDERED: 1. CT Chest w/o March 2019

## 2016-09-11 NOTE — Addendum Note (Signed)
Addended by: Pamalee LeydenWIGGINS, Marlyn Rabine J on: 09/11/2016 05:10 PM   Modules accepted: Orders

## 2016-10-10 ENCOUNTER — Other Ambulatory Visit: Payer: Self-pay | Admitting: Family

## 2016-10-10 DIAGNOSIS — Z1231 Encounter for screening mammogram for malignant neoplasm of breast: Secondary | ICD-10-CM

## 2016-10-15 ENCOUNTER — Other Ambulatory Visit: Payer: Self-pay | Admitting: Family

## 2016-10-15 DIAGNOSIS — R5381 Other malaise: Secondary | ICD-10-CM

## 2016-10-17 ENCOUNTER — Other Ambulatory Visit: Payer: Self-pay | Admitting: Family

## 2016-10-17 DIAGNOSIS — M810 Age-related osteoporosis without current pathological fracture: Secondary | ICD-10-CM

## 2016-11-01 ENCOUNTER — Ambulatory Visit
Admission: RE | Admit: 2016-11-01 | Discharge: 2016-11-01 | Disposition: A | Discharge status: Home / Self Care | Payer: Medicare Other | Source: Ambulatory Visit | Attending: Family | Admitting: Family

## 2016-11-01 ENCOUNTER — Ambulatory Visit: Payer: Medicare Other

## 2016-11-01 DIAGNOSIS — M810 Age-related osteoporosis without current pathological fracture: Secondary | ICD-10-CM

## 2016-11-01 DIAGNOSIS — Z1231 Encounter for screening mammogram for malignant neoplasm of breast: Secondary | ICD-10-CM

## 2017-03-11 ENCOUNTER — Ambulatory Visit (INDEPENDENT_AMBULATORY_CARE_PROVIDER_SITE_OTHER)
Admission: RE | Admit: 2017-03-11 | Discharge: 2017-03-11 | Disposition: A | Discharge status: Home / Self Care | Payer: Medicare Other | Source: Ambulatory Visit | Attending: Pulmonary Disease | Admitting: Pulmonary Disease

## 2017-03-11 ENCOUNTER — Other Ambulatory Visit (INDEPENDENT_AMBULATORY_CARE_PROVIDER_SITE_OTHER): Payer: Medicare Other

## 2017-03-11 ENCOUNTER — Encounter: Payer: Self-pay | Admitting: Pulmonary Disease

## 2017-03-11 ENCOUNTER — Ambulatory Visit (INDEPENDENT_AMBULATORY_CARE_PROVIDER_SITE_OTHER): Payer: Medicare Other | Admitting: Pulmonary Disease

## 2017-03-11 VITALS — BP 140/80 | HR 91 | Ht 62.0 in | Wt 185.2 lb

## 2017-03-11 DIAGNOSIS — B370 Candidal stomatitis: Secondary | ICD-10-CM | POA: Diagnosis not present

## 2017-03-11 DIAGNOSIS — J449 Chronic obstructive pulmonary disease, unspecified: Secondary | ICD-10-CM

## 2017-03-11 DIAGNOSIS — J309 Allergic rhinitis, unspecified: Secondary | ICD-10-CM | POA: Diagnosis not present

## 2017-03-11 DIAGNOSIS — R05 Cough: Secondary | ICD-10-CM | POA: Diagnosis not present

## 2017-03-11 DIAGNOSIS — R918 Other nonspecific abnormal finding of lung field: Secondary | ICD-10-CM | POA: Diagnosis not present

## 2017-03-11 DIAGNOSIS — R059 Cough, unspecified: Secondary | ICD-10-CM

## 2017-03-11 LAB — COMPREHENSIVE METABOLIC PANEL
ALT: 13 U/L (ref 0–35)
AST: 18 U/L (ref 0–37)
Albumin: 4 g/dL (ref 3.5–5.2)
Alkaline Phosphatase: 62 U/L (ref 39–117)
BUN: 12 mg/dL (ref 6–23)
CO2: 29 meq/L (ref 19–32)
Calcium: 9.4 mg/dL (ref 8.4–10.5)
Chloride: 105 mEq/L (ref 96–112)
Creatinine, Ser: 0.86 mg/dL (ref 0.40–1.20)
GFR: 83.02 mL/min (ref >60.00)
GLUCOSE: 100 mg/dL — AB (ref 70–99)
POTASSIUM: 3.9 meq/L (ref 3.5–5.1)
SODIUM: 141 meq/L (ref 135–145)
TOTAL PROTEIN: 7.1 g/dL (ref 6.0–8.3)
Total Bilirubin: 0.6 mg/dL (ref 0.2–1.2)

## 2017-03-11 LAB — CBC WITH DIFFERENTIAL/PLATELET
BASOS ABS: 0 10*3/uL (ref 0.0–0.1)
Basophils Relative: 0.6 % (ref 0.0–3.0)
Eosinophils Absolute: 0.1 10*3/uL (ref 0.0–0.7)
Eosinophils Relative: 2.6 % (ref 0.0–5.0)
HCT: 41.8 % (ref 36.0–46.0)
Hemoglobin: 13.3 g/dL (ref 12.0–15.0)
LYMPHS ABS: 1.1 10*3/uL (ref 0.7–4.0)
Lymphocytes Relative: 21.7 % (ref 12.0–46.0)
MCHC: 31.8 g/dL (ref 30.0–36.0)
MCV: 93 fl (ref 78.0–100.0)
MONO ABS: 0.4 10*3/uL (ref 0.1–1.0)
Monocytes Relative: 8.3 % (ref 3.0–12.0)
NEUTROS ABS: 3.5 10*3/uL (ref 1.4–7.7)
NEUTROS PCT: 66.8 % (ref 43.0–77.0)
PLATELETS: 177 10*3/uL (ref 150.0–400.0)
RBC: 4.5 Mil/uL (ref 3.87–5.11)
RDW: 13.9 % (ref 11.5–15.5)
WBC: 5.3 10*3/uL (ref 4.0–10.5)

## 2017-03-11 MED ORDER — NYSTATIN 100000 UNIT/ML MT SUSP: 5.0000 mL | Freq: Four times a day (QID) | OROMUCOSAL | 1 refills | Status: DC

## 2017-03-11 MED ORDER — FLUTICASONE-UMECLIDIN-VILANT 100-62.5-25 MCG/INH IN AEPB: 1.0000 | INHALATION_SPRAY | Freq: Every day | RESPIRATORY_TRACT | 1 refills | Status: DC

## 2017-03-11 NOTE — Patient Instructions (Addendum)
   We are switching your Anoro to Trelegy.  Use the Trelegy sample we are giving you today by doing 1 puff once daily at the same time every day.  Remember to remove any dentures or partials you have before you use your Trelegy inhaler. Remember to brush your teeth & tongue after you use your inhaler as well as rinse, gargle & spit to keep from getting thrush in your mouth or on your tongue (a white film).   We are sending a prescription for 3 months at a time to your pharmacy for the Trelegy.  I'm also sending a prescription for Nystatin to swish in your mouth & swallow three times a day until the white film goes off of your tongue.   We will see you back in 2 weeks but call if you get any worse or have questions.   TESTS ORDERED: 1. Serum CBC & CMP today 2. CXR PA/LAT today

## 2017-03-11 NOTE — Progress Notes (Signed)
Subjective:    Patient ID: Carla Russell, female    DOB: 05/05/1945, 71 y.o.   MRN: [id number]  C.C.:  Follow-up for Moderate-Severe COPD w/ Emphysema, Bilateral Lung Nodules/Dominant Right Lower Lobe Nodule, & Chronic Seasonal Allergic Rhinitis.  HPI Moderate-severe COPD with emphysema: Previously prescribed Anoro. She reports she was seen for a URI last week by her PCP. She was prescribed antibiotics and steroids. She reports her cough and dyspnea did improve. Since completing the treatment she does feel she is worsening again. She reports her cough has increased but remains nonproductive. Reports no wheezing. She does feel more dyspnea with the "wet weather". She is swallowing the mucus the she coughs up.   Bilateral lung nodules/dominant lower lobe nodule: Right lower lobe nodule measuring 1.1 cm in maximal dimension. First seen 08/17/2015. Nodule not hypermetabolic on PET/CT. Plan for repeat CT imaging March 2019.   Chronic seasonal allergic rhinitis: Recommended nasal saline rinses last appointment along with over-the-counter antihistamine therapy. She reports minimal to no sinus congestion or drainage.   Review of Systems She has had more nausea but no emesis for the last couple of weeks. She reports central chest pain that she describes as a "squeeze" that is very mild and very infrequent. No fever or chills. No sweats. She reports she has had some mild hot flashes. She denies any persistent headaches or vision loss. She denies any dysphagia or odynophagia.   No Known Allergies  Current Outpatient Medications on File Prior to Visit  Medication Sig Dispense Refill  . albuterol (PROVENTIL HFA;VENTOLIN HFA) 108 (90 Base) MCG/ACT inhaler Inhale 2 puffs into the lungs every 4 (four) hours as needed. 3 Inhaler 3  . albuterol (PROVENTIL) (2.5 MG/3ML) 0.083% nebulizer solution Inhale 2.5 mg into the lungs every 6 (six) hours as needed.    Marland Kitchen. alendronate (FOSAMAX) 70 MG tablet Take 70 mg by mouth  every 7 (seven) days.    . Multiple Vitamin (MULTIVITAMIN) tablet Take 1 tablet by mouth daily.    Marland Kitchen. umeclidinium-vilanterol (ANORO ELLIPTA) 62.5-25 MCG/INH AEPB Inhale 1 puff into the lungs daily. 180 each 3   No current facility-administered medications on file prior to visit.     Past Medical History:  Diagnosis Date  . Emphysema of lung (HCC)   . Hypertension   . Osteoporosis     Past Surgical History:  Procedure Laterality Date  . GASTRIC BYPASS      Family History  Problem Relation Age of Onset  . Cancer Mother        leukemia  . Heart attack Father   . Hyperlipidemia Father   . Alcohol abuse Son   . Cancer Paternal Aunt   . Asthma Grandchild     Social History   Socioeconomic History  . Marital status: Single    Spouse name: None  . Number of children: None  . Years of education: None  . Highest education level: None  Social Needs  . Financial resource strain: None  . Food insecurity - worry: None  . Food insecurity - inability: None  . Transportation needs - medical: None  . Transportation needs - non-medical: None  Occupational History  . Occupation: Retired - currently substitue    Comment: Geologist, engineeringTeacher Assistant  Tobacco Use  . Smoking status: Former Smoker    Packs/day: 0.50    Years: 45.00    Pack years: 22.50    Types: Cigarettes    Start date: 06/28/1960    Last attempt to quit:  04/29/2005    Years since quitting: 11.8  . Smokeless tobacco: Never Used  Substance and Sexual Activity  . Alcohol use: Yes    Alcohol/week: 0.0 oz    Comment: beer - daily   . Drug use: No  . Sexual activity: None  Other Topics Concern  . None  Social History Narrative   Originally from [place], [place]. Moved to  in 2013. 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 No pets currently. No bird exposure. Mold in her current home was treated. Previously enjoyed Research scientist (life sciences).         Objective:   Physical Exam BP 140/80 (BP Location: Left Arm, Cuff Size: Normal)   Pulse 91   Ht 5\' 2"  (1.575 m)   Wt 185 lb 4 oz (84 kg)   SpO2 90%   BMI 33.88 kg/m   General:  Awake. No distress. Comfortable. Integument:  No rash. Warm. Dry. Extremities:  No cyanosis or clubbing.  HEENT:  No scleral icterus. Moist mucous membranes. Poor dentition. Oral thrush present. Cardiovascular:  Regular rate. No edema. Regular rhythm.  Pulmonary:  Faint end expiratory wheeze in the apices. Otherwise clear to auscultation with good aeration even in the bases. Normal work of breathing. Abdomen: Soft. Normal bowel sounds. Protuberant. Musculoskeletal:  Normal bulk and tone. No joint effusion appreciated. Neurological:  Cranial nerves 2-12 grossly in tact. No meningismus. Moving all 4 extremities equally.   PFT 01/17/16: FVC 1.92 L (93%) FEV1 1.01 L (63%) FEV1/FVC 0.53 FEF 25-75 0.37 L (25%) negative bronchodilator response 10/16/15: FVC 1.59 L (76%) FEV1 0.82 L (50%) FEV1/FVC 0.51 FEF 25-75 0.34 L (23%) positive bronchodilator response TLC 4.87 L (102%) RV 135% DLCO uncorrected 67%  01/17/16:  Walked 336 meters / Baseline Sat 100% on RA / Nadir Sat 91% on RA  IMAGING CT CHEST W/O 07/24/16 (previously reviewed by me): No pleural effusion or thickening. No pericardial effusion. No pathologic mediastinal adenopathy. Apical predominant centrilobular emphysema noted again. Right lower lobe nodule is relatively unchanged in size of maximal dimension 1 cm & continued somewhat globular appearance. No other developing nodule or opacity appreciated.  PET CT 10/24/15 (previously reviewed by me): No abnormal uptake within 1 cm right lower lobe nodule. No hypermetabolic areas elsewhere in the body either.  CT CHEST W/O 10/10/15 (previously reviewed by me): No pathologic mediastinal adenopathy. No pericardial effusion. No pleural effusion or thickening. 1.1 x 0.9 cm right lower lobe nodule relatively  unchanged. No other new or developing nodules appreciated. The goal predominant emphysematous changes. Suggestion of bronchiectasis in lower lobes.  CT CHEST W/ 07/12/15 (previously reviewed by me): No axillary, mediastinal, or hilar lymph nodes are enlarged. Heart normal in size. No pleural or pericardial effusion. Bronchial wall thickening of the major airways and cylindrical bronchiectasis in the lower lobes greater than right middle lobe & lingula. Mild central lobular emphysema. Mild scarring and/or subsegmental atelectasis noted in right middle lobe, lingula, and basilar left lower lobe. Small areas of peripheral nodularity left upper & left lower lobes with tree-in-bud configuration. 9 mm subpleural nodule medial basal right lower lobe. 10 mm nodule superiorly right lower lobe.  LABS 10/16/15 ANA:  Negative Anti-CCP:  <16 SSA:  <1.0 SSB:  <1.0 Urine Histoplasma Antigen:  <0.5 Urine Blastomyces Antigen:  Negative  P-ANCA:  1:40  09/04/15 Alpha-1 antitrypsin: MM (99)    Assessment & Plan:  71 y.o. female with moderate-severe COPD with  emphysema. Patient's cough and nausea could certainly be due to the weather change and her oral thrush respectively. She may benefit from the addition of inhaled corticosteroid to her regimen. I instructed the patient to contact my office if she had any clinical worsening before her follow-up appointment.  1. Cough: Suspect due to underlying COPD. Checking chest x-ray PA/LAT today. Checking CBC and CMP. 2. Moderate-severe COPD with emphysema: Switching from Anoro to Trelegy.  3. Oral thrush: Prescribing nystatin swish and swallow 3 times a day. Patient instructed on proper oral hygiene. 4. Bilateral lung nodules/dominant right lower lobe nodule: Plan for repeat CT scan in March 2019. Checking chest x-ray PA/LAT today. 5. Chronic seasonal allergic rhinitis: No significant symptoms at this time.  6. Health maintenance: Status post Prevnar 11 June 2013 &  Pneumovax 19 November 2011.  7. Follow-up: Return to clinic in 2 weeks.  Donna ChristenJennings E. Jamison NeighborNestor, M.D. [facility] Pulmonary & Critical Care Pager:  [phone] After 3pm or if no response, call [phone] 9:36 AM 03/11/17

## 2017-03-12 ENCOUNTER — Telehealth: Payer: Self-pay | Admitting: *Deleted

## 2017-03-13 ENCOUNTER — Telehealth: Payer: Self-pay | Admitting: Pulmonary Disease

## 2017-03-13 NOTE — Telephone Encounter (Signed)
ATC pt, no answer. Left message for pt to call back.  

## 2017-03-13 NOTE — Telephone Encounter (Signed)
Notes recorded by Roslynn AmbleNestor, Jennings E, MD on 03/11/2017 at 5:28 PM EST Please let the patient know that her blood work was essentially normal. Her chest x-ray does show her right lung nodule. This has not changed in size when compared with previous x-ray imaging. There is no suggestion of any pneumonia. Thank you.

## 2017-03-13 NOTE — Telephone Encounter (Signed)
Patient returned call for results, I wasn't able to get anyone in triage and when I went back to the call she was not there.

## 2017-03-13 NOTE — Telephone Encounter (Signed)
Pt calling again about CT results. [phone]

## 2017-03-13 NOTE — Telephone Encounter (Signed)
Wrong doctor °

## 2017-03-13 NOTE — Telephone Encounter (Signed)
Pt is aware of results and voiced her understanding. Nothing further needed.  

## 2017-03-13 NOTE — Telephone Encounter (Signed)
Notes recorded by Roslynn AmbleNestor, Jennings E, MD on 03/11/2017 at 5:28 PM EST Please let the patient know that her blood work was essentially normal. Her chest x-ray does show her right lung nodule. This has not changed in size when compared with previous x-ray imaging. There is no suggestion of any pneumonia. Thank you.  lmtcb x1 for pt

## 2017-03-27 ENCOUNTER — Ambulatory Visit (INDEPENDENT_AMBULATORY_CARE_PROVIDER_SITE_OTHER): Payer: Medicare Other | Admitting: Pulmonary Disease

## 2017-03-27 VITALS — BP 130/74 | HR 100 | Wt 184.4 lb

## 2017-03-27 DIAGNOSIS — R918 Other nonspecific abnormal finding of lung field: Secondary | ICD-10-CM

## 2017-03-27 DIAGNOSIS — J449 Chronic obstructive pulmonary disease, unspecified: Secondary | ICD-10-CM

## 2017-03-27 MED ORDER — FLUTICASONE-UMECLIDIN-VILANT 100-62.5-25 MCG/INH IN AEPB: 1.0000 | INHALATION_SPRAY | Freq: Every day | RESPIRATORY_TRACT | 6 refills | Status: DC

## 2017-03-27 NOTE — Patient Instructions (Addendum)
   Continue taking the Trelegy.   We will plan on seeing you back after your CT scan in March.   Please call my office if you have any new breathing problems or questions before your next appointment.

## 2017-03-27 NOTE — Progress Notes (Signed)
Subjective:    Patient ID: Carla Russell, female    DOB: 06/13/1945, 71 y.o.   MRN: [id number]  C.C.:  Follow-up for Moderate-Severe COPD w/ Emphysema, Bilateral Lung Nodules/Dominant Right Lower Lobe Nodule, & Chronic Seasonal Allergic Rhinitis.  HPI Moderate-severe COPD with emphysema: Previously prescribed Anoro that was switched to Trelegy at last appointment. She reports her cough has significantly improved. She denies any wheezing. No change in her baseline dyspnea.   Bilateral lung nodules/dominant lower lobe nodule: Right lower lobe nodule measuring 1.1 cm in maximal dimension first seen 08/17/2015. Head CT without hypermetabolism. Plan for repeat CT imaging March 2019.  Chronic seasonal allergic rhinitis: Not previously on medication therapy. No sinus congestion or drainage.   Review of Systems No chest pain, pressure, or tightness. No fever or chills. No abdominal pain. Minimal nausea.   No Known Allergies  Current Outpatient Medications on File Prior to Visit  Medication Sig Dispense Refill  . albuterol (PROVENTIL HFA;VENTOLIN HFA) 108 (90 Base) MCG/ACT inhaler Inhale 2 puffs into the lungs every 4 (four) hours as needed. 3 Inhaler 3  . albuterol (PROVENTIL) (2.5 MG/3ML) 0.083% nebulizer solution Inhale 2.5 mg into the lungs every 6 (six) hours as needed.    Marland Kitchen. alendronate (FOSAMAX) 70 MG tablet Take 70 mg by mouth every 7 (seven) days.    . Fluticasone-Umeclidin-Vilant (TRELEGY ELLIPTA) 100-62.5-25 MCG/INH AEPB Inhale 1 puff daily into the lungs. 3 each 1  . Multiple Vitamin (MULTIVITAMIN) tablet Take 1 tablet by mouth daily.    Marland Kitchen. nystatin (MYCOSTATIN) 100000 UNIT/ML suspension Take 5 mLs (500,000 Units total) 4 (four) times daily by mouth. 60 mL 1   No current facility-administered medications on file prior to visit.     Past Medical History:  Diagnosis Date  . Emphysema of lung (HCC)   . Hypertension   . Osteoporosis     Past Surgical History:  Procedure Laterality  Date  . GASTRIC BYPASS      Family History  Problem Relation Age of Onset  . Cancer Mother        leukemia  . Heart attack Father   . Hyperlipidemia Father   . Alcohol abuse Son   . Cancer Paternal Aunt   . Asthma Grandchild     Social History   Socioeconomic History  . Marital status: Single    Spouse name: Not on file  . Number of children: Not on file  . Years of education: Not on file  . Highest education level: Not on file  Social Needs  . Financial resource strain: Not on file  . Food insecurity - worry: Not on file  . Food insecurity - inability: Not on file  . Transportation needs - medical: Not on file  . Transportation needs - non-medical: Not on file  Occupational History  . Occupation: Retired - currently substitue    Comment: Geologist, engineeringTeacher Assistant  Tobacco Use  . Smoking status: Former Smoker    Packs/day: 0.50    Years: 45.00    Pack years: 22.50    Types: Cigarettes    Start date: 06/28/1960    Last attempt to quit: 04/29/2005    Years since quitting: 11.9  . Smokeless tobacco: Never Used  Substance and Sexual Activity  . Alcohol use: Yes    Alcohol/week: 0.0 oz    Comment: beer - daily   . Drug use: No  . Sexual activity: Not on file  Other Topics Concern  . Not on file  Social  History Narrative   Originally from [place], [place]. Moved to [place] in 2013. 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 No pets currently. No bird exposure. Mold in her current home was treated. Previously enjoyed Research scientist (life sciences).       Objective:   Physical Exam BP 130/74 (BP Location: Left Arm, Cuff Size: Normal)   Pulse 100   Wt 184 lb 6.4 oz (83.6 kg)   SpO2 93%   BMI 33.73 kg/m   General:  Awake. Alert. Central obesity.  Integument:  No rash. Warm. Dry. Extremities:  No cyanosis or clubbing.  HEENT:  Poor dentition. Moist mucous membranes. No scleral icterus. Cardiovascular:  Regular rate.  No edema. Unable to appreciate JVD.  Pulmonary:  Clear bilaterally with auscultation. Normal work of breathing on room air. Abdomen: Soft. Normal bowel sounds. Mildly protuberant. Musculoskeletal:  Normal bulk and tone. No joint deformity or effusion appreciated. Neurological:  Cranial nerves 2-12 grossly in tact. No meningismus. Moving all 4 extremities equally.   PFT 01/17/16: FVC 1.92 L (93%) FEV1 1.01 L (63%) FEV1/FVC 0.53 FEF 25-75 0.37 L (25%) negative bronchodilator response 10/16/15: FVC 1.59 L (76%) FEV1 0.82 L (50%) FEV1/FVC 0.51 FEF 25-75 0.34 L (23%) positive bronchodilator response TLC 4.87 L (102%) RV 135% DLCO uncorrected 67%  01/17/16:  Walked 336 meters / Baseline Sat 100% on RA / Nadir Sat 91% on RA  IMAGING CXR PA/LAT 03/11/17 (personally reviewed by me):  Right middle lobe nodule unchanged. No new focal opacity or mass appreciated. No pleural effusion. Heart normal in size & mediastinum normal in contour.  CT CHEST W/O 07/24/16 (previously reviewed by me): No pleural effusion or thickening. No pericardial effusion. No pathologic mediastinal adenopathy. Apical predominant centrilobular emphysema noted again. Right lower lobe nodule is relatively unchanged in size of maximal dimension 1 cm & continued somewhat globular appearance. No other developing nodule or opacity appreciated.  PET CT 10/24/15 (previously reviewed by me): No abnormal uptake within 1 cm right lower lobe nodule. No hypermetabolic areas elsewhere in the body either.  CT CHEST W/O 10/10/15 (previously reviewed by me): No pathologic mediastinal adenopathy. No pericardial effusion. No pleural effusion or thickening. 1.1 x 0.9 cm right lower lobe nodule relatively unchanged. No other new or developing nodules appreciated. The goal predominant emphysematous changes. Suggestion of bronchiectasis in lower lobes.  CT CHEST W/ 07/12/15 (previously reviewed by me): No axillary, mediastinal, or hilar lymph nodes are  enlarged. Heart normal in size. No pleural or pericardial effusion. Bronchial wall thickening of the major airways and cylindrical bronchiectasis in the lower lobes greater than right middle lobe & lingula. Mild central lobular emphysema. Mild scarring and/or subsegmental atelectasis noted in right middle lobe, lingula, and basilar left lower lobe. Small areas of peripheral nodularity left upper & left lower lobes with tree-in-bud configuration. 9 mm subpleural nodule medial basal right lower lobe. 10 mm nodule superiorly right lower lobe.  LABS 10/16/15 ANA:  Negative Anti-CCP:  <16 SSA:  <1.0 SSB:  <1.0 Urine Histoplasma Antigen:  <0.5 Urine Blastomyces Antigen:  Negative  P-ANCA:  1:40  09/04/15 Alpha-1 antitrypsin: MM (99)    Assessment & Plan:  71 y.o. female with underlying moderate-severe COPD with emphysema. Patient also with bilateral lung nodules and a dominant right lower lobe nodule. Reviewing her chest x-ray showed no evidence of progression of her right sided lung nodule. Symptomatically she has improved with the addition of an inhaled  corticosteroid to her regimen. As such, I am continuing her on her current inhaler. I instructed the patient to contact my office if she had any new breathing problems or questions before her next appointment.  1. Moderate-severe COPD:  Continuing Trelegy Ellpita. No new medications. 2. Bilateral lung nodules/dominant right lower lobe nodule: Plan for repeat CT imaging March 2019. 3. Health maintenance: Status post Prevnar 11 June 2013 & Pneumovax 19 November 2011. Had Flu Vaccine already per patient. 4. Follow-up: Return to clinic in 4 months or sooner if needed.   Donna ChristenJennings E. Jamison NeighborNestor, M.D. [facility] Pager:  [phone] After 3pm or if no response, call [phone] 4:08 PM 03/27/17

## 2017-06-05 ENCOUNTER — Telehealth: Payer: Self-pay | Admitting: Acute Care

## 2017-06-05 DIAGNOSIS — R918 Other nonspecific abnormal finding of lung field: Secondary | ICD-10-CM

## 2017-06-05 NOTE — Telephone Encounter (Signed)
Patient is former [facility] pt, last seen 01-17-17 In need of repeat CT Chest w/o contrast on March 2019 Original order discon't; new order placed under SG Pt needs 74mo f/u with SG after CT chest scan Placed recall for letter for 74mo with SG Nothing further needed at this time

## 2017-07-03 ENCOUNTER — Ambulatory Visit (INDEPENDENT_AMBULATORY_CARE_PROVIDER_SITE_OTHER)
Admission: RE | Admit: 2017-07-03 | Discharge: 2017-07-03 | Disposition: A | Discharge status: Home / Self Care | Payer: Medicare Other | Source: Ambulatory Visit | Attending: Acute Care | Admitting: Acute Care

## 2017-07-03 DIAGNOSIS — R918 Other nonspecific abnormal finding of lung field: Secondary | ICD-10-CM | POA: Diagnosis not present

## 2017-07-07 ENCOUNTER — Telehealth: Payer: Self-pay | Admitting: Acute Care

## 2017-07-07 NOTE — Progress Notes (Signed)
Was able to talk to the patient regarding their results.  They verbalized an understanding of what was discussed. No further questions at this time. Patient has follow up on 4.9.19 with Dr. Isaiah SergeMannam

## 2017-07-07 NOTE — Telephone Encounter (Signed)
Called and spoke with patient she is aware of results and verbalized understanding. Patient is scheduled for 08/05/17 with Dr. Isaiah SergeMannam

## 2017-08-05 ENCOUNTER — Ambulatory Visit (INDEPENDENT_AMBULATORY_CARE_PROVIDER_SITE_OTHER): Payer: Medicare Other | Admitting: Pulmonary Disease

## 2017-08-05 ENCOUNTER — Encounter: Payer: Self-pay | Admitting: Pulmonary Disease

## 2017-08-05 VITALS — BP 128/88 | HR 85 | Ht 61.0 in | Wt 176.4 lb

## 2017-08-05 DIAGNOSIS — R918 Other nonspecific abnormal finding of lung field: Secondary | ICD-10-CM | POA: Diagnosis not present

## 2017-08-05 DIAGNOSIS — R911 Solitary pulmonary nodule: Secondary | ICD-10-CM | POA: Diagnosis not present

## 2017-08-05 DIAGNOSIS — J449 Chronic obstructive pulmonary disease, unspecified: Secondary | ICD-10-CM | POA: Diagnosis not present

## 2017-08-05 NOTE — Progress Notes (Signed)
Standley DakinsClaudia Offenberger    [id number]    05/03/1945  Primary Care Physician:Smothers, Cathleen Cortieborah N, NP  Referring Physician: Glenetta BorgSmothers, Cathleen Cortieborah N, NP [address] [place], [place] [address]  Chief complaint: Follow-up for COPD, lung nodule  HPI: 72 year old with moderate COPD, lung nodule.  Previously followed by Dr. Jamison NeighborNestor Previously maintained on anoro.  This has been switched to trelegy in 2018 with significant improvement in symptoms.  She has some minor cough, denies any wheezing, dyspnea. She is also being followed up for lung nodule since 2017 which have remained stable.  PET scan noted with no FDG uptake.  Outpatient Encounter Medications as of 08/05/2017  Medication Sig  . alendronate (FOSAMAX) 70 MG tablet Take 70 mg by mouth every 7 (seven) days.  . Fluticasone-Umeclidin-Vilant (TRELEGY ELLIPTA) 100-62.5-25 MCG/INH AEPB Inhale 1 puff into the lungs daily.  . Multiple Vitamin (MULTIVITAMIN) tablet Take 1 tablet by mouth daily.  Marland Kitchen. albuterol (PROVENTIL HFA;VENTOLIN HFA) 108 (90 Base) MCG/ACT inhaler Inhale 2 puffs into the lungs every 4 (four) hours as needed.  Marland Kitchen. albuterol (PROVENTIL) (2.5 MG/3ML) 0.083% nebulizer solution Inhale 2.5 mg into the lungs every 6 (six) hours as needed.  . nystatin (MYCOSTATIN) 100000 UNIT/ML suspension Take 5 mLs (500,000 Units total) 4 (four) times daily by mouth. (Patient not taking: Reported on 08/05/2017)   No facility-administered encounter medications on file as of 08/05/2017.     Allergies as of 08/05/2017  . (No Known Allergies)    Past Medical History:  Diagnosis Date  . Emphysema of lung (HCC)   . Hypertension   . Osteoporosis     Past Surgical History:  Procedure Laterality Date  . GASTRIC BYPASS      Family History  Problem Relation Age of Onset  . Cancer Mother        leukemia  . Heart attack Father   . Hyperlipidemia Father   . Alcohol abuse Son   . Cancer Paternal Aunt   . Asthma Grandchild     Social  History   Socioeconomic History  . Marital status: Single    Spouse name: Not on file  . Number of children: Not on file  . Years of education: Not on file  . Highest education level: Not on file  Occupational History  . Occupation: Retired - currently [employer]    Comment: Geologist, engineeringTeacher Assistant  Social Needs  . Financial resource strain: Not on file  . Food insecurity:    Worry: Not on file    Inability: Not on file  . Transportation needs:    Medical: Not on file    Non-medical: Not on file  Tobacco Use  . Smoking status: Former Smoker    Packs/day: 0.50    Years: 45.00    Pack years: 22.50    Types: Cigarettes    Start date: 06/28/1960    Last attempt to quit: 04/29/2005    Years since quitting: 12.2  . Smokeless tobacco: Never Used  Substance and Sexual Activity  . Alcohol use: Yes    Alcohol/week: 0.0 oz    Comment: beer - daily   . Drug use: No  . Sexual activity: Not on file  Lifestyle  . Physical activity:    Days per week: Not on file    Minutes per session: Not on file  . Stress: Not on file  Relationships  . Social connections:    Talks on phone: Not on file  Gets together: Not on file    Attends religious service: Not on file    Active member of club or organization: Not on file    Attends meetings of clubs or organizations: Not on file    Relationship status: Not on file  . Intimate partner violence:    Fear of current or ex partner: Not on file    Emotionally abused: Not on file    Physically abused: Not on file    Forced sexual activity: Not on file  Other Topics Concern  . Not on file  Social History Narrative   Originally from [place], [place]. Moved to [place] in 2013. 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 No pets currently. No bird exposure. Mold in her current home was treated. Previously enjoyed Research scientist (life sciences).     Review of systems: Review of Systems    Constitutional: Negative for fever and chills.  HENT: Negative.   Eyes: Negative for blurred vision.  Respiratory: as per HPI  Cardiovascular: Negative for chest pain and palpitations.  Gastrointestinal: Negative for vomiting, diarrhea, blood per rectum. Genitourinary: Negative for dysuria, urgency, frequency and hematuria.  Musculoskeletal: Negative for myalgias, back pain and joint pain.  Skin: Negative for itching and rash.  Neurological: Negative for dizziness, tremors, focal weakness, seizures and loss of consciousness.  Endo/Heme/Allergies: Negative for environmental allergies.  Psychiatric/Behavioral: Negative for depression, suicidal ideas and hallucinations.  All other systems reviewed and are negative.  Physical Exam: Blood pressure 128/88, pulse 85, height 5\' 1"  (1.549 m), weight 176 lb 6.4 oz (80 kg), SpO2 97 %. Gen:      No acute distress HEENT:  EOMI, sclera anicteric Neck:     No masses; no thyromegaly Lungs:    Clear to auscultation bilaterally; normal respiratory effort CV:         Regular rate and rhythm; no murmurs Abd:      + bowel sounds; soft, non-tender; no palpable masses, no distension Ext:    No edema; adequate peripheral perfusion Skin:      Warm and dry; no rash Neuro: alert and oriented x 3 Psych: normal mood and affect  Data Reviewed: PFTs 01/17/16 FVC 1.90 [92%], FEV1 1.05 [65%], F/F 55 Moderate obstruction  PFT 10/16/15 FVC 1.92 [92%], FEV1 1.03 [64%], F/F 54, TLC 102%, RV/TLC 134%, DLCO 67%, DLCO/VA 103% Moderate obstruction with bronchodilator response, air-trapping, mild diffusion defect  CT scan 10/10/15- lobulated nodule in the right lower lobe, bilateral emphysematous changes PET scan 10/24/15-no FDG uptake in the lung nodule. CT scan 01/25/16-stable right lower lobe nodule, emphysema CT scan 07/24/16-stable nodule, emphysema CT scan 07/03/17-stable nodule, emphysema I have reviewed the images personally.  Assessment:  Moderate COPD Stable  on trelegy, continue same  Lung nodule This has remained stable since 2017.  PET scan in 2017 shows no FDG uptake. The nodule is likely benign.  We will repeat another scan next year.  If still stable then we may consider stopping follow-up.  Plan/Recommendations: - Continue trelegy - CT without contrast in 1 year.  Chilton Greathouse MD  Pulmonary and Critical Care 08/05/2017, 10:47 AM  CC: Smothers, Cathleen Corti, NP

## 2017-08-05 NOTE — Patient Instructions (Signed)
I am glad you are doing well with your breathing Continue trelegy inhaler Your CT scan shows that the lung nodule is stable so that is good news.  The lung nodule is probably benign We will keep a check on it and follow-up with a CT in 1 year. Follow-up in clinic in 1 year.

## 2018-08-04 ENCOUNTER — Telehealth: Payer: Self-pay | Admitting: *Deleted

## 2018-08-05 NOTE — Telephone Encounter (Signed)
Follow Up:; ° ° °Returning your call. °

## 2018-08-10 ENCOUNTER — Inpatient Hospital Stay: Admission: RE | Admit: 2018-08-10 | Payer: Medicare Other | Source: Ambulatory Visit

## 2018-08-14 ENCOUNTER — Encounter: Payer: Self-pay | Admitting: Cardiology

## 2018-08-17 ENCOUNTER — Other Ambulatory Visit: Payer: Self-pay

## 2018-08-17 ENCOUNTER — Ambulatory Visit (INDEPENDENT_AMBULATORY_CARE_PROVIDER_SITE_OTHER): Payer: Medicare Other | Admitting: Cardiology

## 2018-08-17 ENCOUNTER — Encounter: Payer: Self-pay | Admitting: Cardiology

## 2018-08-17 DIAGNOSIS — R911 Solitary pulmonary nodule: Secondary | ICD-10-CM

## 2018-08-17 DIAGNOSIS — I2510 Atherosclerotic heart disease of native coronary artery without angina pectoris: Secondary | ICD-10-CM | POA: Diagnosis not present

## 2018-08-17 DIAGNOSIS — R0609 Other forms of dyspnea: Secondary | ICD-10-CM | POA: Diagnosis not present

## 2018-08-17 DIAGNOSIS — I10 Essential (primary) hypertension: Secondary | ICD-10-CM | POA: Diagnosis not present

## 2018-08-17 DIAGNOSIS — I739 Peripheral vascular disease, unspecified: Secondary | ICD-10-CM | POA: Diagnosis not present

## 2018-08-17 NOTE — Progress Notes (Signed)
Subjective:   Carla Russell, female    DOB: [birth year], 73 y.o.   MRN: [id number]  Smothers, Andree Elk, NP:  No chief complaint on file.  This visit type was conducted due to national recommendations for restrictions regarding the COVID-19 Pandemic (e.g. social distancing).  This format is felt to be most appropriate for this patient at this time.  All issues noted in this document were discussed and addressed.  No physical exam was performed (except for noted visual exam findings with Telehealth visits).  The patient has consented to conduct a Telehealth visit and understands insurance will be billed.   I discussed the limitations of evaluation and management by telemedicine and the availability of in person appointments. The patient expressed understanding and agreed to proceed.  Virtual Visit via Video Note is as below  I connected with Ms. Jump, on 08/17/18 at [place] by telephone and verified that I am speaking with the correct person using two identifiers. Unable to perform video visit as patient did not have equipment.    I have discussed with her regarding the safety during COVID Pandemic and steps and precautions including social distancing with the patient.    HPI: Carla Russell  is a 73 y.o. female  with hypertension, emphysema, PAD, and coronary artery calcification.   Lexiscan nuclear stress test in Sept 2019 was considered low risk study, although, had small perfusion defect in apical wall felt ot be likely breast tissue attenuation; however, could not exclude ischemia. Echocardiogram in Oct 2019 showed mod LVH and normal LVEF. She was noted to have mild PAD with mildly abnormal waveform in right ankle and ABI 0.96 by ABI performed in Oct 2019. Left ABI was 1.1. Medical management recommended.  This is a 6 month virtual visit. She is tolerating medications well pressure has been well controlled. Continues to have dyspnea on exertion that is unchanged. Unable to exercise due  to restrictions; however, does climb her stairs at her apartment. Notes that she has to rest after climbing 2 flights of stairs. No chest pain, palpitations, PND or orthopnea. Symptoms of claudication and cramping in her legs have improved.   She is followed by Dr. Vaughan Browner for right lung nodule. She states that she is due to have CT scan to follow up on her lung nodule and is concerned about this.    Past Medical History:  Diagnosis Date  . Coronary artery calcification seen on CAT scan   . Dyspnea on exertion   . Emphysema of lung ([place])   . Hypertension   . Nodule of right lung   . Osteoporosis   . Peripheral artery disease [facility])     Past Surgical History:  Procedure Laterality Date  . GASTRIC BYPASS      Family History  Problem Relation Age of Onset  . Cancer Mother        leukemia  . Heart attack Father   . Hyperlipidemia Father   . Alcohol abuse Son   . Cancer Paternal Aunt   . Asthma Grandchild   . Hypertension Brother     Social History   Socioeconomic History  . Marital status: Widowed    Spouse name: Not on file  . Number of children: 1  . Years of education: Not on file  . Highest education level: Not on file  Occupational History  . Occupation: Retired - currently [employer]    Comment: Control and instrumentation engineer  Social Needs  . Financial resource strain: Not on  file  . Food insecurity:    Worry: Not on file    Inability: Not on file  . Transportation needs:    Medical: Not on file    Non-medical: Not on file  Tobacco Use  . Smoking status: Former Smoker    Packs/day: 0.50    Years: 45.00    Pack years: 22.50    Types: Cigarettes    Start date: 06/28/1960    Last attempt to quit: 04/29/2005    Years since quitting: 13.3  . Smokeless tobacco: Never Used  Substance and Sexual Activity  . Alcohol use: Yes    Alcohol/week: 0.0 standard drinks    Comment: beer - daily   . Drug use: No  . Sexual activity: Not on file  Lifestyle  . Physical activity:    Days  per week: Not on file    Minutes per session: Not on file  . Stress: Not on file  Relationships  . Social connections:    Talks on phone: Not on file    Gets together: Not on file    Attends religious service: Not on file    Active member of club or organization: Not on file    Attends meetings of clubs or organizations: Not on file    Relationship status: Not on file  . Intimate partner violence:    Fear of current or ex partner: Not on file    Emotionally abused: Not on file    Physically abused: Not on file    Forced sexual activity: Not on file  Other Topics Concern  . Not on file  Social History Narrative   Originally from [place], [place]. Moved to [place] in 2013. 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 No pets currently. No bird exposure. Mold in her current home was treated. Previously enjoyed Geographical information systems officer.     Current Meds  Medication Sig  . albuterol (PROVENTIL HFA;VENTOLIN HFA) 108 (90 Base) MCG/ACT inhaler Inhale 2 puffs into the lungs every 4 (four) hours as needed.  Marland Kitchen albuterol (PROVENTIL) (2.5 MG/3ML) 0.083% nebulizer solution Inhale 2.5 mg into the lungs every 6 (six) hours as needed.  Marland Kitchen amLODipine (NORVASC) 10 MG tablet Take 10 mg by mouth daily.  Marland Kitchen aspirin EC 81 MG tablet Take 81 mg by mouth daily.  . Calcium Carbonate (CALCIUM 600 PO) Take 1 tablet by mouth 2 (two) times daily.  . Fluticasone-Umeclidin-Vilant (TRELEGY ELLIPTA) 100-62.5-25 MCG/INH AEPB Inhale 1 puff into the lungs daily.  Marland Kitchen olmesartan-hydrochlorothiazide (BENICAR HCT) 20-12.5 MG tablet Take 1 tablet by mouth daily.  . pravastatin (PRAVACHOL) 20 MG tablet Take 20 mg by mouth daily.     Review of Systems  Constitution: Negative for decreased appetite, malaise/fatigue, weight gain and weight loss.  Eyes: Negative for visual disturbance.  Cardiovascular: Positive for dyspnea on exertion. Negative for chest pain,  claudication, leg swelling, orthopnea, palpitations and syncope.  Respiratory: Negative for hemoptysis and wheezing.   Endocrine: Negative for cold intolerance and heat intolerance.  Hematologic/Lymphatic: Does not bruise/bleed easily.  Skin: Negative for nail changes.  Musculoskeletal: Negative for muscle weakness and myalgias.  Gastrointestinal: Negative for abdominal pain, change in bowel habit, nausea and vomiting.  Neurological: Negative for difficulty with concentration, dizziness, focal weakness and headaches.  Psychiatric/Behavioral: Negative for altered mental status and suicidal ideas.  All other systems reviewed and are negative.      Objective:     There were  no vitals taken for this visit.  Cardiac studies:  Echocardiogram 02/09/2018: Left ventricle cavity is normal in size. Moderate concentric hypertrophy of the left ventricle. Normal global wall motion. Doppler evidence of grade I (impaired) diastolic dysfunction, normal LAP. Calculated EF 59%. No significant valvular abnormality. Inadequate tricuspid regurgitation jet to estimate pulmonary artery pressure. Normal right atrial pressure.   Lexiscan myoview stress test 01/12/2018: 1. Lexiscan stress test was performed. Exercise capacity was not assessed. Stress symptoms included dyspnea.. Blood pressure was normal. The resting and stress electrocardiogram demonstrated sinus rhythm, incomplete RBBB, no resting arrhythmias and normal rest repolarization. Stress EKG is non diagnostic for ischemia as it is a pharmacologic stress. 2. The overall quality of the study is fair. Review of the raw data in a rotational cine format reveals breast attenuation. Left ventricular cavity is noted to be normal on the rest and stress studies. The left ventricular ejection fraction was calculated or visually estimated to be 58%. Small sized mildly decreased intensity apical to basal inferior perfusion defect worse on rest images, likely  represent breast attenuation, with imaging performed in sitting position. Ischemia in this region cannot be excluded. 3. Low risk study.  ABI 02/09/2018: This exam reveals mildly decreased perfusion of the right lower extremity, noted at the anterior and post tibial artery level (ABI 0.96) and normal perfusion of the left lower extremity (ABI 1.11). Mildly abnormal waveform right ankle and normal left ankle. Study suggests mild PAD.  CT of chest 07/03/2017:  1. Right lower lobe nodule is unchanged from 07/12/2015 and was not shown to be hypermetabolic on 29/51/8841, findings supportive of a benign lesion. 2. Aortic atherosclerosis (ICD10-170.0). Three-vessel coronary artery calcification. 3.  Emphysema (ICD10-J43.9).  Recent Labs:  01/22/2018: Creatinine 1.02, EGFR 54/63, potassium 4.2, BMP otherwise normal.  12/04/2017: Potassium 4.0, creatinine 0.7, EGFR 76/88, CMP. Cholesterol 227, triglycerides 186, HDL 106, LDL 84. NHDL 111 Vitamin D normal. TSH 0.36.  *Physical exam not performed as this is a telephone visit*        Assessment & Recommendations:  1. Essential hypertension Blood pressure appears to be stable by her report. Checked by PCP regularly. She does not have a way to check her BP at home. Tolerating medications well. Will continue the same.   2. Dyspnea on exertion Stable. Likely related to COPD. I have encouraged her to start exercise.  3. PAD (peripheral artery disease) (HCC) Symptoms have improved. Continue with statin and ASA.   4. Coronary artery calcification seen on CT scan Negative nuclear stress test. Continue with statin and ASA. No chest pain.   5. Right lower lobe pulmonary nodule Due for repeat CT scan for follow up and also to see Dr. Vaughan Browner. She will discuss with her PCP about arranging her follow up.    Plan: Patient is stable from cardiac standpoint, will have her follow up with [place] in 4 months in office and if she remains stable, will have her follow  up on PRN basis.    Jeri Lager, MSN, APRN, FNP-C [facility], [place] Office: [phone] Fax: [phone]

## 2018-10-05 ENCOUNTER — Other Ambulatory Visit: Payer: Self-pay

## 2018-10-05 ENCOUNTER — Ambulatory Visit (INDEPENDENT_AMBULATORY_CARE_PROVIDER_SITE_OTHER)
Admission: RE | Admit: 2018-10-05 | Discharge: 2018-10-05 | Disposition: A | Discharge status: Home / Self Care | Payer: Medicare Other | Source: Ambulatory Visit | Attending: Pulmonary Disease | Admitting: Pulmonary Disease

## 2018-10-05 DIAGNOSIS — R918 Other nonspecific abnormal finding of lung field: Secondary | ICD-10-CM | POA: Diagnosis not present

## 2018-12-16 ENCOUNTER — Ambulatory Visit (INDEPENDENT_AMBULATORY_CARE_PROVIDER_SITE_OTHER): Payer: Medicare Other | Admitting: Cardiology

## 2018-12-16 ENCOUNTER — Other Ambulatory Visit: Payer: Self-pay

## 2018-12-16 ENCOUNTER — Encounter: Payer: Self-pay | Admitting: Cardiology

## 2018-12-16 VITALS — BP 141/71 | HR 68 | Ht 61.0 in | Wt 181.0 lb

## 2018-12-16 DIAGNOSIS — R911 Solitary pulmonary nodule: Secondary | ICD-10-CM

## 2018-12-16 DIAGNOSIS — I10 Essential (primary) hypertension: Secondary | ICD-10-CM | POA: Diagnosis not present

## 2018-12-16 DIAGNOSIS — R0609 Other forms of dyspnea: Secondary | ICD-10-CM | POA: Diagnosis not present

## 2018-12-16 DIAGNOSIS — I2510 Atherosclerotic heart disease of native coronary artery without angina pectoris: Secondary | ICD-10-CM

## 2018-12-16 DIAGNOSIS — I739 Peripheral vascular disease, unspecified: Secondary | ICD-10-CM

## 2018-12-16 NOTE — Progress Notes (Signed)
Primary Physician:  Smothers, Cathleen Cortieborah N, NP   Patient ID: Carla Russell, female    DOB: 10/15/1945, 73 y.o.   MRN: [id number]  Subjective:    Chief Complaint  Patient presents with  . Hypertension    HPI: Carla DakinsClaudia Russell  is a 73 y.o. female  with hypertension, emphysema, PAD, and coronary artery calcification.   Lexiscan nuclear stress test in Sept 2019 was considered low risk study, although, had small perfusion defect in apical wall felt ot be likely breast tissue attenuation; however, could not exclude ischemia. Echocardiogram in Oct 2019 showed mod LVH and normal LVEF. She was noted to have mild PAD with mildly abnormal waveform in right ankle and ABI 0.96 by ABI performed in Oct 2019. Left ABI was 1.1. Medical management recommended.  Patient is here for 4 month follow up visit. She is presently doing well without any significant complaints. Has mild symptoms of claudication and dyspnea on exertion that are stable. She has not been very active since the pandemic. Does notice claudication and dyspnea particularly with climbing stairs outside of her apartment. No chest pain. Tolerating medications well.   She is followed by Dr. Isaiah SergeMannam for right lung nodule. She states that she is due to have CT scan to follow up on her lung nodule and is concerned about this.   Past Medical History:  Diagnosis Date  . Coronary artery calcification seen on CAT scan   . Dyspnea on exertion   . Emphysema of lung (HCC)   . Hypertension   . Nodule of right lung   . Osteoporosis   . Peripheral artery disease [facility](HCC)     Past Surgical History:  Procedure Laterality Date  . GASTRIC BYPASS      Social History   Socioeconomic History  . Marital status: Widowed    Spouse name: Not on file  . Number of children: 1  . Years of education: Not on file  . Highest education level: Not on file  Occupational History  . Occupation: Retired - currently [employer]    Comment: Geologist, engineeringTeacher Assistant  Social  Needs  . Financial resource strain: Not on file  . Food insecurity    Worry: Not on file    Inability: Not on file  . Transportation needs    Medical: Not on file    Non-medical: Not on file  Tobacco Use  . Smoking status: Former Smoker    Packs/day: 0.50    Years: 45.00    Pack years: 22.50    Types: Cigarettes    Start date: 06/28/1960    Quit date: 04/29/2005    Years since quitting: 13.6  . Smokeless tobacco: Never Used  Substance and Sexual Activity  . Alcohol use: Yes    Alcohol/week: 0.0 standard drinks    Comment: beer - daily   . Drug use: No  . Sexual activity: Not on file  Lifestyle  . Physical activity    Days per week: Not on file    Minutes per session: Not on file  . Stress: Not on file  Relationships  . Social Musicianconnections    Talks on phone: Not on file    Gets together: Not on file    Attends religious service: Not on file    Active member of club or organization: Not on file    Attends meetings of clubs or organizations: Not on file    Relationship status: Not on file  . Intimate partner violence    Fear  of current or ex partner: Not on file    Emotionally abused: Not on file    Physically abused: Not on file    Forced sexual activity: Not on file  Other Topics Concern  . Not on file  Social History Narrative   Originally [place]. Moved to [place] in 2013. 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 No pets currently. No bird exposure. Mold in her current home was treated. Previously enjoyed Research scientist (life sciences)playing bingo.     Review of Systems  Constitution: Negative for decreased appetite, malaise/fatigue, weight gain and weight loss.  Eyes: Negative for visual disturbance.  Cardiovascular: Positive for claudication (stable) and dyspnea on exertion. Negative for chest pain, leg swelling, orthopnea, palpitations and syncope.  Respiratory: Negative for hemoptysis and wheezing.    Endocrine: Negative for cold intolerance and heat intolerance.  Hematologic/Lymphatic: Does not bruise/bleed easily.  Skin: Negative for nail changes.  Musculoskeletal: Negative for muscle weakness and myalgias.  Gastrointestinal: Negative for abdominal pain, change in bowel habit, nausea and vomiting.  Neurological: Negative for difficulty with concentration, dizziness, focal weakness and headaches.  Psychiatric/Behavioral: Negative for altered mental status and suicidal ideas.  All other systems reviewed and are negative.     Objective:  Blood pressure (!) 141/71, pulse 68, height 5\' 1"  (1.549 m), weight 181 lb (82.1 kg), SpO2 93 %. Body mass index is 34.2 kg/m.    Physical Exam  Constitutional: She is oriented to person, place, and time. Vital signs are normal. She appears well-developed and well-nourished.  HENT:  Head: Normocephalic and atraumatic.  Neck: Normal range of motion.  Cardiovascular: Normal rate, regular rhythm, normal heart sounds and intact distal pulses.  Pulses:      Femoral pulses are 2+ on the right side and 2+ on the left side.      Popliteal pulses are 2+ on the right side and 2+ on the left side.       Dorsalis pedis pulses are 2+ on the right side and 2+ on the left side.       Posterior tibial pulses are 2+ on the right side and 1+ on the left side.  Pulmonary/Chest: Effort normal. No accessory muscle usage. No respiratory distress. She has decreased breath sounds.  Abdominal: Soft. Bowel sounds are normal.  Musculoskeletal: Normal range of motion.  Neurological: She is alert and oriented to person, place, and time.  Skin: Skin is warm and dry.  Vitals reviewed.  Radiology: No results found.  Laboratory examination:    CMP Latest Ref Rng & Units 03/11/2017  Glucose 70 - 99 mg/dL 161(W100(H)  BUN 6 - 23 mg/dL 12  Creatinine 9.600.40 - 4.541.20 mg/dL 0.980.86  Sodium 119135 - 147145 mEq/L 141  Potassium 3.5 - 5.1 mEq/L 3.9  Chloride 96 - 112 mEq/L 105  CO2 19 - 32  mEq/L 29  Calcium 8.4 - 10.5 mg/dL 9.4  Total Protein 6.0 - 8.3 g/dL 7.1  Total Bilirubin 0.2 - 1.2 mg/dL 0.6  Alkaline Phos 39 - 117 U/L 62  AST 0 - 37 U/L 18  ALT 0 - 35 U/L 13   CBC Latest Ref Rng & Units 03/11/2017  WBC 4.0 - 10.5 K/uL 5.3  Hemoglobin 12.0 - 15.0 g/dL 82.913.3  Hematocrit 56.236.0 - 46.0 % 41.8  Platelets 150.0 - 400.0 K/uL 177.0   Lipid Panel  No results found for: CHOL, TRIG, HDL, CHOLHDL, VLDL, LDLCALC, LDLDIRECT HEMOGLOBIN A1C  No results found for: HGBA1C, MPG TSH No results for input(s): TSH in the last 8760 hours.  PRN Meds:. Medications Discontinued During This Encounter  Medication Reason  . albuterol (PROVENTIL HFA;VENTOLIN HFA) 108 (90 Base) MCG/ACT inhaler   . albuterol (PROVENTIL) (2.5 MG/3ML) 0.083% nebulizer solution   . nystatin (MYCOSTATIN) 100000 UNIT/ML suspension    Current Meds  Medication Sig  . amLODipine (NORVASC) 10 MG tablet Take 10 mg by mouth daily.  Marland Kitchen aspirin EC 81 MG tablet Take 81 mg by mouth daily.  . Calcium Carbonate (CALCIUM 600 PO) Take 1 tablet by mouth 2 (two) times daily.  . Fluticasone-Umeclidin-Vilant (TRELEGY ELLIPTA) 100-62.5-25 MCG/INH AEPB Inhale 1 puff into the lungs daily.  . Multiple Vitamin (MULTIVITAMIN) tablet Take 1 tablet by mouth daily.  Marland Kitchen olmesartan-hydrochlorothiazide (BENICAR HCT) 20-12.5 MG tablet Take 1 tablet by mouth daily.  . pravastatin (PRAVACHOL) 20 MG tablet Take 20 mg by mouth daily.    Cardiac Studies:   Echocardiogram 02/09/2018: Left ventricle cavity is normal in size. Moderate concentric hypertrophy of the left ventricle. Normal global wall motion. Doppler evidence of grade I (impaired) diastolic dysfunction, normal LAP. Calculated EF 59%. No significant valvular abnormality. Inadequate tricuspid regurgitation jet to estimate pulmonary artery pressure. Normal right atrial pressure.   Lexiscan myoview stress test 01/12/2018: 1. Lexiscan stress test was performed. Exercise capacity was not  assessed. Stress symptoms included dyspnea.. Blood pressure was normal. The resting and stress electrocardiogram demonstrated sinus rhythm, incomplete RBBB, no resting arrhythmias and normal rest repolarization. Stress EKG is non diagnostic for ischemia as it is a pharmacologic stress. 2. The overall quality of the study is fair. Review of the raw data in a rotational cine format reveals breast attenuation. Left ventricular cavity is noted to be normal on the rest and stress studies. The left ventricular ejection fraction was calculated or visually estimated to be 58%. Small sized mildly decreased intensity apical to basal inferior perfusion defect worse on rest images, likely represent breast attenuation, with imaging performed in sitting position. Ischemia in this region cannot be excluded. 3. Low risk study.  ABI 02/09/2018: This exam reveals mildly decreased perfusion of the right lower extremity, noted at the anterior and post tibial artery level (ABI 0.96) and normal perfusion of the left lower extremity (ABI 1.11). Mildly abnormal waveform right ankle and normal left ankle. Study suggests mild PAD.  CT of chest 07/03/2017:  1. Right lower lobe nodule is unchanged from 07/12/2015 and was not shown to be hypermetabolic on 78/58/8502, findings supportive of a benign lesion. 2. Aortic atherosclerosis (ICD10-170.0). Three-vessel coronary artery calcification. 3.  Emphysema (ICD10-J43.9).  Assessment:     ICD-10-CM   1. Essential hypertension  I10 EKG 12-Lead  2. Dyspnea on exertion  R06.09 EKG 12-Lead  3. PAD (peripheral artery disease) (HCC)  I73.9 EKG 12-Lead  4. Coronary artery calcification seen on CT scan  I25.10 EKG 12-Lead  5. Right lower lobe pulmonary nodule  R91.1 EKG 12-Lead    EKG 12/16/2018: Normal sinus rhythm at 64 bpm, left axis deviation, IRBBB. No evidence of ischemia.   Recommendations:   Blood pressure has remained stable with current medical therapy, will  continue the same.  He is overall doing well.  She does continue to have mild, but stable symptoms of claudication and dyspnea on exertion.  Admits to not being very active over the last few months.  I discussed the benefits of increasing her activity level to help with her PAD as well as  prevent any deconditioning.  She will start exercising regularly.  She is on appropriate medical therapy.  No evidence of ischemic limb and vascular exam is unchanged.  She has had negative nuclear stress test for coronary artery calcification seen on CT scan, continue with aggressive risk factor modification.  She is to see her PCP in the next month and will have labs performed at that time, will request that lipids be performed for surveillance.  She states recent CT scan of right lower lobe pulmonary nodule has been stable.  I will see her back in 6 months or sooner if needed.  Toniann FailAshton Haynes Mikayela Deats, MSN, APRN, FNP-C [facility] [place] Office: [phone] Fax: [phone]

## 2019-06-18 ENCOUNTER — Ambulatory Visit: Payer: BLUE CROSS/BLUE SHIELD | Admitting: Cardiology

## 2019-06-21 ENCOUNTER — Encounter: Payer: Self-pay | Admitting: Cardiology

## 2019-06-21 ENCOUNTER — Ambulatory Visit: Payer: Medicare Other | Admitting: Cardiology

## 2019-06-21 ENCOUNTER — Other Ambulatory Visit: Payer: Self-pay

## 2019-06-21 VITALS — BP 144/72 | HR 93 | Temp 97.9°F | Ht 61.0 in | Wt 194.0 lb

## 2019-06-21 DIAGNOSIS — I2510 Atherosclerotic heart disease of native coronary artery without angina pectoris: Secondary | ICD-10-CM

## 2019-06-21 DIAGNOSIS — I739 Peripheral vascular disease, unspecified: Secondary | ICD-10-CM

## 2019-06-21 DIAGNOSIS — R0609 Other forms of dyspnea: Secondary | ICD-10-CM

## 2019-06-21 DIAGNOSIS — I10 Essential (primary) hypertension: Secondary | ICD-10-CM

## 2019-06-21 MED ORDER — CILOSTAZOL 50 MG PO TABS: 50.0000 mg | ORAL_TABLET | Freq: Two times a day (BID) | ORAL | 1 refills | Status: DC

## 2019-06-21 NOTE — Progress Notes (Signed)
Primary Physician:  Smothers, Cathleen Corti, NP   Patient ID: Carla Russell, female    DOB: [birth year], 74 y.o.   MRN: [id number]  Subjective:    Chief Complaint  Patient presents with  . PAD  . DOE  . Follow-up    6 month    HPI: Carla Russell  is a 74 y.o. female  with hypertension, emphysema, PAD, and coronary artery calcification. Lexiscan nuclear stress test in Sept 2019 was considered low risk study, although, had small perfusion defect in apical wall felt ot be likely breast tissue attenuation; however, could not exclude ischemia. Echocardiogram in Oct 2019 showed mod LVH and normal LVEF. She was noted to have mild PAD with mildly abnormal waveform in right ankle and ABI 0.96 by ABI performed in Oct 2019. Left ABI was 1.1. Medical management recommended.  Patient is here for 6 month follow up visit. Dyspnea on exertion has been stable. Over the last few weeks, she has noticed worsening pain in her right leg with walking and states it is limiting her activity. No chest pain. Tolerating medications well.   She is followed by Dr. Isaiah Serge for right lung nodule.   Past Medical History:  Diagnosis Date  . Coronary artery calcification seen on CAT scan   . Dyspnea on exertion   . Emphysema of lung (HCC)   . Hypertension   . Nodule of right lung   . Osteoporosis   . Peripheral artery disease [facility])     Past Surgical History:  Procedure Laterality Date  . GASTRIC BYPASS     Social History   Tobacco Use  . Smoking status: Former Smoker    Packs/day: 0.50    Years: 45.00    Pack years: 22.50    Types: Cigarettes    Start date: 06/28/1960    Quit date: 04/29/2005    Years since quitting: 14.1  . Smokeless tobacco: Never Used  Substance Use Topics  . Alcohol use: Yes    Alcohol/week: 0.0 standard drinks    Comment: occ     Review of Systems  Constitution: Negative for decreased appetite, malaise/fatigue, weight gain and weight loss.  Eyes: Negative for visual  disturbance.  Cardiovascular: Positive for claudication (worsening on the right) and dyspnea on exertion (stable). Negative for chest pain, leg swelling, orthopnea, palpitations and syncope.  Respiratory: Negative for hemoptysis and wheezing.   Endocrine: Negative for cold intolerance and heat intolerance.  Hematologic/Lymphatic: Does not bruise/bleed easily.  Skin: Negative for nail changes.  Musculoskeletal: Negative for muscle weakness and myalgias.  Gastrointestinal: Negative for abdominal pain, change in bowel habit, nausea and vomiting.  Neurological: Negative for difficulty with concentration, dizziness, focal weakness and headaches.  Psychiatric/Behavioral: Negative for altered mental status and suicidal ideas.  All other systems reviewed and are negative.     Objective:  Blood pressure (!) 144/72, pulse 93, temperature 97.9 F (36.6 C), height 5\' 1"  (1.549 m), weight 194 lb (88 kg), SpO2 95 %. Body mass index is 36.66 kg/m.    Physical Exam  Constitutional: She is oriented to person, place, and time. Vital signs are normal. She appears well-developed and well-nourished.  HENT:  Head: Normocephalic and atraumatic.  Cardiovascular: Normal rate, regular rhythm, normal heart sounds and intact distal pulses.  Pulses:      Femoral pulses are 2+ on the right side and 2+ on the left side.      Popliteal pulses are 2+ on the right side and 2+ on the left  side.       Dorsalis pedis pulses are 1+ on the right side and 2+ on the left side.       Posterior tibial pulses are 1+ on the right side and 1+ on the left side.  Pulmonary/Chest: Effort normal. No accessory muscle usage. No respiratory distress. She has decreased breath sounds.  Abdominal: Soft. Bowel sounds are normal.  Musculoskeletal:        General: Normal range of motion.     Cervical back: Normal range of motion.  Neurological: She is alert and oriented to person, place, and time.  Skin: Skin is warm and dry.  Vitals  reviewed.  Radiology:  CT of Chest 10/15/2018:  Stable 10 mm right lower lobe pulmonary nodule dating back to 2017 compatible with a benign nodule.  Stable bronchiectasis.  Diffuse coronary artery disease.  Aortic Atherosclerosis (ICD10-I70.0).  Laboratory examination:    CMP Latest Ref Rng & Units 03/11/2017  Glucose 70 - 99 mg/dL 100(H)  BUN 6 - 23 mg/dL 12  Creatinine 0.40 - 1.20 mg/dL 0.86  Sodium 135 - 145 mEq/L 141  Potassium 3.5 - 5.1 mEq/L 3.9  Chloride 96 - 112 mEq/L 105  CO2 19 - 32 mEq/L 29  Calcium 8.4 - 10.5 mg/dL 9.4  Total Protein 6.0 - 8.3 g/dL 7.1  Total Bilirubin 0.2 - 1.2 mg/dL 0.6  Alkaline Phos 39 - 117 U/L 62  AST 0 - 37 U/L 18  ALT 0 - 35 U/L 13   CBC Latest Ref Rng & Units 03/11/2017  WBC 4.0 - 10.5 K/uL 5.3  Hemoglobin 12.0 - 15.0 g/dL 13.3  Hematocrit 36.0 - 46.0 % 41.8  Platelets 150.0 - 400.0 K/uL 177.0   Lipid Panel  No results found for: CHOL, TRIG, HDL, CHOLHDL, VLDL, LDLCALC, LDLDIRECT HEMOGLOBIN A1C No results found for: HGBA1C, MPG TSH No results for input(s): TSH in the last 8760 hours.  PRN Meds:. There are no discontinued medications. Current Meds  Medication Sig  . alendronate (FOSAMAX) 70 MG tablet Take 70 mg by mouth once a week.  Marland Kitchen amLODipine (NORVASC) 10 MG tablet Take 10 mg by mouth daily.  Marland Kitchen aspirin EC 81 MG tablet Take 81 mg by mouth daily.  . Calcium Carbonate (CALCIUM 600 PO) Take 1 tablet by mouth 2 (two) times daily.  . Cyanocobalamin (VITAMIN B-12 PO) Take 5,000 mg by mouth daily.  . Fluticasone-Umeclidin-Vilant (TRELEGY ELLIPTA) 100-62.5-25 MCG/INH AEPB Inhale 1 puff into the lungs daily.  . Multiple Vitamin (MULTIVITAMIN) tablet Take 1 tablet by mouth daily.  Marland Kitchen olmesartan-hydrochlorothiazide (BENICAR HCT) 20-12.5 MG tablet Take 1 tablet by mouth daily.  . pravastatin (PRAVACHOL) 20 MG tablet Take 20 mg by mouth daily.    Cardiac Studies:   Echocardiogram 02/09/2018: Left ventricle cavity is normal in  size. Moderate concentric hypertrophy of the left ventricle. Normal global wall motion. Doppler evidence of grade I (impaired) diastolic dysfunction, normal LAP. Calculated EF 59%. No significant valvular abnormality. Inadequate tricuspid regurgitation jet to estimate pulmonary artery pressure. Normal right atrial pressure.   Lexiscan myoview stress test 01/12/2018: 1. Lexiscan stress test was performed. Exercise capacity was not assessed. Stress symptoms included dyspnea.. Blood pressure was normal. The resting and stress electrocardiogram demonstrated sinus rhythm, incomplete RBBB, no resting arrhythmias and normal rest repolarization. Stress EKG is non diagnostic for ischemia as it is a pharmacologic stress. 2. The overall quality of the study is fair. Review of the raw data in a rotational cine format reveals breast  attenuation. Left ventricular cavity is noted to be normal on the rest and stress studies. The left ventricular ejection fraction was calculated or visually estimated to be 58%. Small sized mildly decreased intensity apical to basal inferior perfusion defect worse on rest images, likely represent breast attenuation, with imaging performed in sitting position. Ischemia in this region cannot be excluded. 3. Low risk study.  ABI 02/09/2018: This exam reveals mildly decreased perfusion of the right lower extremity, noted at the anterior and post tibial artery level (ABI 0.96) and normal perfusion of the left lower extremity (ABI 1.11). Mildly abnormal waveform right ankle and normal left ankle. Study suggests mild PAD.  CT of chest 07/03/2017:  1. Right lower lobe nodule is unchanged from 07/12/2015 and was not shown to be hypermetabolic on 10/24/2015, findings supportive of a benign lesion. 2. Aortic atherosclerosis (ICD10-170.0). Three-vessel coronary artery calcification. 3.  Emphysema (ICD10-J43.9).  Assessment:     ICD-10-CM   1. PAD (peripheral artery disease) (HCC)   I73.9 EKG 12-Lead    EKG 12-Lead  2. Coronary artery calcification seen on CT scan  I25.10   3. Essential hypertension  I10 Lipid Profile    Comprehensive Metabolic Panel (CMET)  4. Dyspnea on exertion  R06.00     EKG 06/21/2019: Normal sinus rhythm at 90 bpm, normal axis, IRBBB. No evidence of ischemia.   Recommendations:   1.PAD with claudication: Worsening symptoms of claudication particularly on her right leg. Has mildly abnormal vascular exam. No critical limb ischemia. Previous ABI was mildly abnormal in 2018, will obtain lower extremity arterial duplex for further evaluation. Start Cilostazol 50 mg BID.  Continue with ASA and statin therapy. Encouraged her to start regular walking.  2. Coronary artery calcification on CT scan No symptoms of angina. Negative nuclear stress test in 2019.  Needs aggressive lipid control. Has not had recent lipids. She is seeing her PCP this week, will request that lipids be performed at that time. Advised patient to contact me if she is unable to perform, and will need to have performed at [facility].  3. Hypertension Slightly elevated today. States generally well controlled. Will have this reevaluated this week with her PCP. May consider beta blocker therapy if continues to be elevated or also Aldactone as she does have intermittent leg swelling that she controls with support stockings.  4. Dyspnea on exertion Stable. Likely multifactoral from COPD, inactivity, and weight. Will continue with follow up with Dr. Isaiah Serge.  Follow up with [place] after LE arterial duplex in 4 weeks. Toniann Fail, MSN, APRN, FNP-C [facility] [place] Office: [phone] Fax: [phone]

## 2019-06-25 LAB — LIPID PANEL
Chol/HDL Ratio: 1.9 ratio (ref 0.0–4.4)
Cholesterol, Total: 175 mg/dL (ref 100–199)
HDL: 94 mg/dL (ref >39)
LDL Chol Calc (NIH): 65 mg/dL (ref 0–99)
Triglycerides: 93 mg/dL (ref 0–149)
VLDL Cholesterol Cal: 16 mg/dL (ref 5–40)

## 2019-06-25 LAB — COMPREHENSIVE METABOLIC PANEL
ALT: 17 IU/L (ref 0–32)
AST: 24 IU/L (ref 0–40)
Albumin/Globulin Ratio: 1.6 (ref 1.2–2.2)
Albumin: 4.2 g/dL (ref 3.7–4.7)
Alkaline Phosphatase: 61 IU/L (ref 39–117)
BUN/Creatinine Ratio: 17 (ref 12–28)
BUN: 17 mg/dL (ref 8–27)
Bilirubin Total: 0.5 mg/dL (ref 0.0–1.2)
CO2: 24 mmol/L (ref 20–29)
Calcium: 9.5 mg/dL (ref 8.7–10.3)
Chloride: 104 mmol/L (ref 96–106)
Creatinine, Ser: 1.02 mg/dL — ABNORMAL HIGH (ref 0.57–1.00)
GFR calc Af Amer: 62 mL/min/{1.73_m2} (ref >59)
GFR calc non Af Amer: 54 mL/min/{1.73_m2} — ABNORMAL LOW (ref >59)
Globulin, Total: 2.6 g/dL (ref 1.5–4.5)
Glucose: 86 mg/dL (ref 65–99)
Potassium: 4.8 mmol/L (ref 3.5–5.2)
Sodium: 143 mmol/L (ref 134–144)
Total Protein: 6.8 g/dL (ref 6.0–8.5)

## 2019-06-25 NOTE — Progress Notes (Signed)
No that is fine, for some reason it wasn't showing her follow up appt

## 2019-06-25 NOTE — Progress Notes (Signed)
She is scheduled for LEA on 07/06/2019 and follow-up on 07/21/2019. Should I move her follow-up to a sooner date?

## 2019-07-06 ENCOUNTER — Ambulatory Visit: Payer: Medicare Other

## 2019-07-06 ENCOUNTER — Other Ambulatory Visit: Payer: Self-pay

## 2019-07-06 DIAGNOSIS — I739 Peripheral vascular disease, unspecified: Secondary | ICD-10-CM

## 2019-07-13 ENCOUNTER — Other Ambulatory Visit: Payer: Self-pay | Admitting: Cardiology

## 2019-07-19 ENCOUNTER — Telehealth: Payer: Self-pay

## 2019-07-19 NOTE — Telephone Encounter (Signed)
Spoke with patient about keeping upcoming appointment to discuss test results. Patient voiced understanding.

## 2019-07-21 ENCOUNTER — Ambulatory Visit: Payer: BLUE CROSS/BLUE SHIELD | Admitting: Cardiology

## 2019-07-26 ENCOUNTER — Ambulatory Visit: Payer: BLUE CROSS/BLUE SHIELD | Admitting: Cardiology

## 2019-07-29 ENCOUNTER — Other Ambulatory Visit: Payer: Self-pay

## 2019-07-29 ENCOUNTER — Ambulatory Visit: Payer: Medicare Other | Admitting: Cardiology

## 2019-07-29 ENCOUNTER — Encounter: Payer: Self-pay | Admitting: Cardiology

## 2019-07-29 VITALS — BP 145/66 | HR 86 | Temp 97.8°F | Ht 62.0 in | Wt 196.0 lb

## 2019-07-29 DIAGNOSIS — Z87891 Personal history of nicotine dependence: Secondary | ICD-10-CM

## 2019-07-29 DIAGNOSIS — I739 Peripheral vascular disease, unspecified: Secondary | ICD-10-CM

## 2019-07-29 DIAGNOSIS — I10 Essential (primary) hypertension: Secondary | ICD-10-CM

## 2019-07-29 DIAGNOSIS — R0609 Other forms of dyspnea: Secondary | ICD-10-CM

## 2019-07-29 DIAGNOSIS — I2510 Atherosclerotic heart disease of native coronary artery without angina pectoris: Secondary | ICD-10-CM

## 2019-07-29 NOTE — Progress Notes (Signed)
Carla Russell Date of Birth: [birth year] MRN: [id number] Primary Care Provider:Smothers, Cathleen Corti, NP  Date: 07/29/19 Last Office Visit: June 21, 2019  Chief Complaint  Patient presents with  . Results    follow up    HPI  Carla Russell is a 74 y.o.  female who presents to the office with a chief complaint of " leg pain and follow-up on test results." Patient's past medical history and cardiovascular risk factors include: hypertension, emphysema, PAD,coronary artery calcification, postmenopausal female, advanced age.  Patient was last seen in the office on June 21, 2019 by Toniann Fail, MSN, APRN, FNP-C.  At last office visit given her symptoms of claudication and prior study showing abnormal ABI she was started on cilostazol 50 mg p.o. twice daily, encouraged to continue aspirin and statin therapy, and walking on a daily basis.  She was also recommended to undergo arterial duplex of the lower extremity to evaluate for obstructive disease.   Since last office visit patient states that she continues to have lower extremity pain upon ambulation.  The pain on the right is worse than the left.  And the discomfort is located in the infrapopliteal region.  Patient states that despite the discomfort she continues to walk on a daily basis.  Repeat ABIs show evidence of mild peripheral vascular disease on the left leg compared to the right; however, her symptoms are more pronounced on the right.  Arterial duplex reports multiphasic waveform in bilateral lower extremities without concern for obstructive disease.   Dyspnea on exertion: Patient is noted to have coronary artery calcification on CT scan in the past.  She is currently on aspirin and statin therapy. Patient had a nuclear stress test in September 2019 which was considered to be low risk with breast tissue attenuation artifact.  She also had an echocardiogram in October 2019 which showed moderate LVH, normal LVEF.  Patient  states that her symptoms of shortness of breath are chronic and stable.  Not worsened since last cardiac evaluation.  Patient's blood pressure have been elevated in the office multiple times.  She does not check her blood pressures at home and states "she does not know how to."  Medications reconciled.  Denies prior history of myocardial infarction, congestive heart failure, deep venous thrombosis, pulmonary embolism, stroke, transient ischemic attack.  ALLERGIES: No Known Allergies  MEDICATION LIST PRIOR TO VISIT: Current Outpatient Medications on File Prior to Visit  Medication Sig Dispense Refill  . alendronate (FOSAMAX) 70 MG tablet Take 70 mg by mouth once a week.    Marland Kitchen amLODipine (NORVASC) 10 MG tablet Take 10 mg by mouth daily.    Marland Kitchen aspirin EC 81 MG tablet Take 81 mg by mouth daily.    . Calcium Carbonate (CALCIUM 600 PO) Take 1 tablet by mouth 2 (two) times daily.    . cilostazol (PLETAL) 50 MG tablet TAKE 1 TABLET BY MOUTH TWICE A DAY 60 tablet 3  . Cyanocobalamin (VITAMIN B-12 PO) Take 5,000 mg by mouth daily.    . Fluticasone-Umeclidin-Vilant (TRELEGY ELLIPTA) 100-62.5-25 MCG/INH AEPB Inhale 1 puff into the lungs daily. 3 each 6  . Multiple Vitamin (MULTIVITAMIN) tablet Take 1 tablet by mouth daily.    Marland Kitchen olmesartan-hydrochlorothiazide (BENICAR HCT) 20-12.5 MG tablet Take 1 tablet by mouth daily.    . pravastatin (PRAVACHOL) 20 MG tablet Take 20 mg by mouth daily.     No current facility-administered medications on file prior to visit.    PAST MEDICAL HISTORY: Past  Medical History:  Diagnosis Date  . Coronary artery calcification seen on CAT scan   . Dyspnea on exertion   . Emphysema of lung (HCC)   . Hypertension   . Nodule of right lung   . Osteoporosis   . Peripheral artery disease (HCC)     PAST SURGICAL HISTORY: Past Surgical History:  Procedure Laterality Date  . GASTRIC BYPASS      FAMILY HISTORY: The patient's family history includes Alcohol abuse in her  son; Asthma in her grandchild; Cancer in her mother and paternal aunt; Heart attack in her father; Hyperlipidemia in her father; Hypertension in her brother.   SOCIAL HISTORY:  The patient  reports that she quit smoking about 14 years ago. Her smoking use included cigarettes. She started smoking about 59 years ago. She has a 22.50 pack-year smoking history. She has never used smokeless tobacco. She reports current alcohol use. She reports that she does not use drugs.  Review of Systems  Constitution: Negative for chills and fever.  HENT: Negative for ear discharge, ear pain and nosebleeds.   Eyes: Negative for blurred vision and discharge.  Cardiovascular: Positive for claudication and dyspnea on exertion. Negative for chest pain, leg swelling, near-syncope, orthopnea, palpitations, paroxysmal nocturnal dyspnea and syncope.  Respiratory: Negative for cough and shortness of breath.   Endocrine: Negative for polydipsia, polyphagia and polyuria.  Hematologic/Lymphatic: Negative for bleeding problem.  Skin: Negative for flushing and nail changes.  Musculoskeletal: Negative for muscle cramps, muscle weakness and myalgias.  Gastrointestinal: Negative for abdominal pain, dysphagia, hematemesis, hematochezia, melena, nausea and vomiting.  Neurological: Negative for dizziness, focal weakness and light-headedness.    PHYSICAL EXAM: Vitals with BMI 07/29/2019 06/21/2019 12/16/2018  Height 5\' 2"  5\' 1"  5\' 1"   Weight 196 lbs 194 lbs 181 lbs  BMI 35.84 36.67 34.22  Systolic 145 144  Diastolic 66 72 71  Pulse 86 93 68    CONSTITUTIONAL: Well-developed and well-nourished. No acute distress.  SKIN: Skin is warm and dry. No rash noted. No cyanosis. No pallor. No jaundice HEAD: Normocephalic and atraumatic.  EYES: No scleral icterus MOUTH/THROAT: Moist oral membranes.  NECK: No JVD present. No thyromegaly noted. No carotid bruits  LYMPHATIC: No visible cervical adenopathy.  CHEST Normal respiratory  effort. No intercostal retractions  LUNGS: Clear to auscultation bilaterally.  No stridor. No wheezes. No rales.  CARDIOVASCULAR: Regular rate and rhythm, positive S1-S2, no murmurs rubs or gallops appreciated. ABDOMINAL: No apparent ascites.  EXTREMITIES: No peripheral edema. Pulses:      Femoral pulses are 2+ on the right side and 2+ on the left side.      Popliteal pulses are 2+ on the right side and 2+ on the left side.       Dorsalis pedis pulses are 1+ on the right side and 2+ on the left side.       Posterior tibial pulses are 1+ on the right side and 1+ on the left side.  HEMATOLOGIC: No significant bruising NEUROLOGIC: Oriented to person, place, and time. Nonfocal. Normal muscle tone.  PSYCHIATRIC: Normal mood and affect. Normal behavior. Cooperative  RADIOLOGY: CT of Chest 10/15/2018:  Stable 10 mm right lower lobe pulmonary nodule dating back to 2017. Compatible with a benign nodule. Stable bronchiectasis. Diffuse coronary artery disease. Aortic Atherosclerosis (ICD10-I70.0).  CARDIAC DATABASE: EKG: 06/21/2019: Normal sinus rhythm at 90 bpm, normal axis, IRBBB. No evidence of ischemia.   Echocardiogram: 02/09/2018: Left ventricle cavity is normal in size. Moderate concentric hypertrophy  of the left ventricle. Normal global wall motion. Doppler evidence of grade I (impaired) diastolic dysfunction, normal LAP. Calculated EF 59%. No significant valvular abnormality. Inadequate tricuspid regurgitation jet to estimate pulmonary artery pressure. Normal right atrial pressure.   Stress Testing:  01/12/2018 Lexiscan stress test: Review of the raw data in a rotational cine format reveals breast attenuation. Left ventricular cavity is noted to be normal on the rest and stress studies. Calculated LVEF by gated SPECT 58%.  Small sized mildly decreased intensity apical to basal inferior perfusion defect worse on rest images, likely represent breast attenuation, with imaging performed in  sitting position. Ischemia in this region cannot be excluded. Low risk study.  Vascular imaging: 02/09/2018: This exam reveals mildly decreased perfusion of the right lower extremity, noted at the anterior and post tibial artery level (ABI 0.96) and normal perfusion of the left lower extremity (ABI 1.11). Mildly abnormal waveform right ankle and normal left ankle. Study suggests mild PAD.  Lower Extremity Arterial Duplex 07/06/2019: No hemodynamically significant stenoses are identified in the lower extremity arterial system.  This exam reveals normal perfusion of the right lower extremity (ABI 0.98) and mildly decreased perfusion of the left lower extremity, noted at the post tibial artery level (ABI 0.88).  Study may suggest mild diffuse small vessel disease.  LABORATORY DATA: CBC Latest Ref Rng & Units 03/11/2017  WBC 4.0 - 10.5 K/uL 5.3  Hemoglobin 12.0 - 15.0 g/dL 40.913.3  Hematocrit 81.136.0 - 46.0 % 41.8  Platelets 150.0 - 400.0 K/uL 177.0    CMP Latest Ref Rng & Units 06/24/2019 03/11/2017  Glucose 65 - 99 mg/dL 86 914(N100(H)  BUN 8 - 27 mg/dL 17 12  Creatinine 8.290.57 - 1.00 mg/dL 5.62(Z1.02(H) 3.080.86  Sodium 657134 - 144 mmol/L 143 141  Potassium 3.5 - 5.2 mmol/L 4.8 3.9  Chloride 96 - 106 mmol/L 104 105  CO2 20 - 29 mmol/L 24 29  Calcium 8.7 - 10.3 mg/dL 9.5 9.4  Total Protein 6.0 - 8.5 g/dL 6.8 7.1  Total Bilirubin 0.0 - 1.2 mg/dL 0.5 0.6  Alkaline Phos 39 - 117 IU/L 61 62  AST 0 - 40 IU/L 24 18  ALT 0 - 32 IU/L 17 13    Lipid Panel     Component Value Date/Time   CHOL 175 06/24/2019 1035   TRIG 93 06/24/2019 1035   HDL 94 06/24/2019 1035   CHOLHDL 1.9 06/24/2019 1035   LDLCALC 65 06/24/2019 1035   LABVLDL 16 06/24/2019 1035   Cardiac Panel (last 3 results) No results for input(s): CKTOTAL, CKMB, TROPONINIHS, RELINDX in the last 72 hours.  FINAL MEDICATION LIST END OF ENCOUNTER: No orders of the defined types were placed in this encounter.   There are no discontinued medications.     Current Outpatient Medications:  .  alendronate (FOSAMAX) 70 MG tablet, Take 70 mg by mouth once a week., Disp: , Rfl:  .  amLODipine (NORVASC) 10 MG tablet, Take 10 mg by mouth daily., Disp: , Rfl:  .  aspirin EC 81 MG tablet, Take 81 mg by mouth daily., Disp: , Rfl:  .  Calcium Carbonate (CALCIUM 600 PO), Take 1 tablet by mouth 2 (two) times daily., Disp: , Rfl:  .  cilostazol (PLETAL) 50 MG tablet, TAKE 1 TABLET BY MOUTH TWICE A DAY, Disp: 60 tablet, Rfl: 3 .  Cyanocobalamin (VITAMIN B-12 PO), Take 5,000 mg by mouth daily., Disp: , Rfl:  .  Fluticasone-Umeclidin-Vilant (TRELEGY ELLIPTA) 100-62.5-25 MCG/INH AEPB, Inhale 1  puff into the lungs daily., Disp: 3 each, Rfl: 6 .  Multiple Vitamin (MULTIVITAMIN) tablet, Take 1 tablet by mouth daily., Disp: , Rfl:  .  olmesartan-hydrochlorothiazide (BENICAR HCT) 20-12.5 MG tablet, Take 1 tablet by mouth daily., Disp: , Rfl:  .  pravastatin (PRAVACHOL) 20 MG tablet, Take 20 mg by mouth daily., Disp: , Rfl:   IMPRESSION:    ICD-10-CM   1. Claudication in peripheral vascular disease (HCC)  I73.9   2. PAD (peripheral artery disease) (HCC)  I73.9   3. Coronary artery calcification seen on CT scan  I25.10   4. Dyspnea on exertion  R06.00   5. Essential hypertension  I10   6. Former smoker  Z87.891      RECOMMENDATIONS: Carla Russell is a 74 y.o. female whose past medical history and cardiovascular risk factors include: hypertension, emphysema, PAD,coronary artery calcification, postmenopausal female, advanced age.  Claudication with abnormal ABI suggestive of peripheral artery disease:  Patient is a former smoker, she understands importance of complete smoking cessation.  Continue cilostazol.  Continue aspirin.  Continue statin therapy.  Encouraged to continue participating in physical activity as tolerated.  According to the report, ABI on the right is 0.98 and ABI on the left is 0.88.  Suggestive of PAD in the left lower extremity.   Pulse volume recording shows diminished waveform at the level of the dorsalis pedis bilaterally.  And ultrasound reports multiphasic waveforms in both bilateral lower extremities.  We discussed undergoing peripheral angiography to evaluate for obstructive disease.  However, after discussing the risks, benefits, and alternatives patient states that she would like to proceed with medical therapy for now and to reevaluate in 2 months.  Coronary artery calcification on CT scan:  Patient does not have any active angina.  Patient had a nuclear stress test in 2019, results noted above.  Continue statin therapy.  Continue aspirin.  Most recent lipid profile from February 2021 reviewed.   Benign essential hypertension: . Office blood pressure is at not goal.  . Medication reconciled.  Recommended taking Norvasc at night. . Educated the patient on how to accurately check her blood pressure.  She is also asked to invest in a blood pressure cuff and to bring her blood pressure log and to the next office visit for further review. . If the blood pressure is consistently greater than patient is asked to call the office to for medication titration sooner than the next office visit.  . Low salt diet recommended. A diet that is rich in fruits, vegetables, legumes, and low-fat dairy products and low in snacks, sweets, and meats (such as the Dietary Approaches to Stop Hypertension [DASH] diet).   Dyspnea on exertion: Chronic and stable.  Patient is noted to have coronary artery calcification on the CT scan as per EMR.  She underwent an ischemic evaluation and nuclear stress test is reported to be low risk overall.  Her symptoms are not progressive in nature.  A degree of her symptoms may be secondary to elevated blood pressures.  I have asked her to keep a log of her blood pressures with the medications can further be titrated at the next office visit.  Patient is educated on seeking medical  attention if her symptoms increase in intensity, frequency, duration or has typical chest discomfort as discussed in the office.  She verbalizes understanding and provides verbal feedback.  Former smoker: Educated on the importance of continued smoking cessation.  No orders of the defined types were placed  in this encounter.  --Continue cardiac medications as reconciled in final medication list. --Return in about 2 months (around 09/28/2019) for Follow-up regarding claudication and discuss peripheral angiogram. Or sooner if needed. --Continue follow-up with your primary care physician regarding the management of your other chronic comorbid conditions.  Patient's questions and concerns were addressed to her satisfaction. She voices understanding of the instructions provided during this encounter.   This note was created using a voice recognition software as a result there may be grammatical errors inadvertently enclosed that do not reflect the nature of this encounter. Every attempt is made to correct such errors.  Rex Kras, [place], [facility]  Pager: [phone] Office: [phone]

## 2019-09-02 ENCOUNTER — Other Ambulatory Visit: Payer: Self-pay | Admitting: Cardiology

## 2019-10-27 ENCOUNTER — Ambulatory Visit (INDEPENDENT_AMBULATORY_CARE_PROVIDER_SITE_OTHER): Payer: Medicare Other

## 2019-10-27 ENCOUNTER — Encounter: Payer: Self-pay | Admitting: Pulmonary Disease

## 2019-10-27 ENCOUNTER — Other Ambulatory Visit: Payer: Self-pay

## 2019-10-27 ENCOUNTER — Ambulatory Visit (INDEPENDENT_AMBULATORY_CARE_PROVIDER_SITE_OTHER): Payer: Medicare Other | Admitting: Pulmonary Disease

## 2019-10-27 VITALS — BP 120/62 | HR 78 | Temp 99.2°F | Ht 63.0 in | Wt 197.6 lb

## 2019-10-27 DIAGNOSIS — I2510 Atherosclerotic heart disease of native coronary artery without angina pectoris: Secondary | ICD-10-CM | POA: Diagnosis not present

## 2019-10-27 DIAGNOSIS — J449 Chronic obstructive pulmonary disease, unspecified: Secondary | ICD-10-CM | POA: Diagnosis not present

## 2019-10-27 MED ORDER — TRELEGY ELLIPTA 100-62.5-25 MCG/INH IN AEPB: 1.0000 | INHALATION_SPRAY | Freq: Every day | RESPIRATORY_TRACT | 6 refills | Status: AC

## 2019-10-27 NOTE — Patient Instructions (Signed)
I am glad you are doing well with regard to your breathing Continue Trelegy inhaler.  We will call in refills for those Chest x-ray today Follow-up in 1 year.

## 2019-10-27 NOTE — Progress Notes (Signed)
         Carla Russell    [id number]    Dec 29, 1945  Primary Care Physician:Smothers, Cathleen Corti, NP  Referring Physician: Iona Hansen, NP [address] [place],  [place] [address]  Chief complaint: Follow-up for COPD, lung nodule  HPI: 74 year old with moderate COPD, lung nodule.  Previously followed by Dr. Jamison Neighbor Previously maintained on anoro.  This has been switched to trelegy in 2018 with significant improvement in symptoms.  She has some minor cough, denies any wheezing, dyspnea. She is also being followed up for lung nodule since 2017 which have remained stable.  PET scan noted with no FDG uptake. Quit smoking around 2020  Interim history Continues on Trelegy inhaler.  Has chronic dyspnea on exertion, cough with white mucus.  No new complaints today  Outpatient Encounter Medications as of 10/27/2019  Medication Sig  . alendronate (FOSAMAX) 70 MG tablet Take 70 mg by mouth once a week.  Marland Kitchen amLODipine (NORVASC) 10 MG tablet Take 10 mg by mouth daily.  Marland Kitchen aspirin EC 81 MG tablet Take 81 mg by mouth daily.  . Calcium Carbonate (CALCIUM 600 PO) Take 1 tablet by mouth 2 (two) times daily.  . cilostazol (PLETAL) 50 MG tablet TAKE 1 TABLET BY MOUTH TWICE A DAY  . Cyanocobalamin (VITAMIN B-12 PO) Take 5,000 mg by mouth daily.  . Fluticasone-Umeclidin-Vilant (TRELEGY ELLIPTA) 100-62.5-25 MCG/INH AEPB Inhale 1 puff into the lungs daily.  . Multiple Vitamin (MULTIVITAMIN) tablet Take 1 tablet by mouth daily.  Marland Kitchen olmesartan-hydrochlorothiazide (BENICAR HCT) 20-12.5 MG tablet Take 1 tablet by mouth daily.  . pravastatin (PRAVACHOL) 20 MG tablet Take 20 mg by mouth daily.   No facility-administered encounter medications on file as of 10/27/2019.   Physical Exam: Blood pressure 120/62, pulse 78, temperature 99.2 F (37.3 C), temperature source Oral, height 5\' 3"  (1.6 m), weight 197 lb 9.6 oz (89.6 kg), SpO2 96 %. Gen:      No acute distress HEENT:  EOMI, sclera anicteric Neck:      No masses; no thyromegaly Lungs:    Clear to auscultation bilaterally; normal respiratory effort CV:         Regular rate and rhythm; no murmurs Abd:      + bowel sounds; soft, non-tender; no palpable masses, no distension Ext:    No edema; adequate peripheral perfusion Skin:      Warm and dry; no rash Neuro: alert and oriented x 3 Psych: normal mood and affect  Data Reviewed: PFTs 01/17/16 FVC 1.90 [92%], FEV1 1.05 [65%], F/F 55 Moderate obstruction  PFT 10/16/15 FVC 1.92 [92%], FEV1 1.03 [64%], F/F 54, TLC 102%, RV/TLC 134%, DLCO 67%, DLCO/VA 103% Moderate obstruction with bronchodilator response, air-trapping, mild diffusion defect  CT scan 10/10/15- lobulated nodule in the right lower lobe, bilateral emphysematous changes PET scan 10/24/15-no FDG uptake in the lung nodule. CT scan 01/25/16-stable right lower lobe nodule, emphysema CT scan 07/24/16-stable nodule, emphysema CT scan 07/03/17-stable nodule, emphysema CT scan 10/05/2018-stable lung nodule, emphysema I have reviewed the images personally.  Assessment:  Moderate COPD Stable on trelegy, continue same Chest x-ray today  Lung nodule This has remained stable since 2017 to 2020.  PET scan in 2017 shows no FDG uptake. The nodule is likely benign and does not need further follow-up.  Plan/Recommendations: - Continue trelegy  2018 MD [facility] 10/27/2019, 11:10 AM  CC: 10/29/2019, NP

## 2019-10-28 ENCOUNTER — Encounter: Payer: Self-pay | Admitting: Cardiology

## 2019-10-28 ENCOUNTER — Ambulatory Visit: Payer: BLUE CROSS/BLUE SHIELD | Admitting: Cardiology

## 2019-10-28 ENCOUNTER — Ambulatory Visit: Payer: Medicare Other | Admitting: Cardiology

## 2019-10-28 VITALS — BP 134/73 | HR 103 | Ht 62.5 in | Wt 198.0 lb

## 2019-10-28 DIAGNOSIS — Z87891 Personal history of nicotine dependence: Secondary | ICD-10-CM

## 2019-10-28 DIAGNOSIS — I2510 Atherosclerotic heart disease of native coronary artery without angina pectoris: Secondary | ICD-10-CM

## 2019-10-28 DIAGNOSIS — I739 Peripheral vascular disease, unspecified: Secondary | ICD-10-CM

## 2019-10-28 DIAGNOSIS — M79604 Pain in right leg: Secondary | ICD-10-CM

## 2019-10-28 DIAGNOSIS — I10 Essential (primary) hypertension: Secondary | ICD-10-CM

## 2019-10-28 NOTE — Progress Notes (Signed)
[name] Date of Birth: 05-04-1945 MRN: [id number] Primary Care Provider:Smothers, Cathleen Corti, NP  Date: 10/28/19 Last Office Visit: 07/29/2019  Chief Complaint  Patient presents with  . Claudication in peripheral vascular disease  . Follow-up    results  c/o  pain right leg    HPI  Carla Russell is a 74 y.o.  female who presents to the office with a chief complaint of " leg pain." Patient's past medical history and cardiovascular risk factors include: hypertension, emphysema, PAD,coronary artery calcification, postmenopausal female, advanced age.  Patient presents to the office for 52-month follow-up regarding lower extremity leg pain.  Patient has undergone a peripheral vascular recent fasting ankle-brachial index and arterial duplex of lower extremity.  She was started on cilostazol given her symptoms of leg pain studies.  At the last office visit patient continues to have lower extremity pain with ambulation.  At last office visit we discussed possibly undergoing abdominal aortic runoff to evaluate for peripheral artery disease.  However, at that time she wanted to continue conservative management.  Since last office visit patient states that her discomfort in the lower extremities has worsened.  She states that the pain is located mostly in the right lower extremity in the infrapopliteal region.  Patient does have a pain with prolonged ambulation but majority of the discomfort is when she tries to get up to start walking.  Interestingly, ABI notes mild peripheral vascular disease in the left lower extremity compared to the right.  But symptomatically her right lower extremity is worse.  An arterial duplex of lower extremity did not demonstrate any obstructive PAD.  Patient has been aspirin, statin therapy, and cilostazol for more than 3 months with continued ambulation on daily basis without any significant improvement in symptoms.  Patient is here for reevaluation and would like to  proceed with abdominal aortic runoff.  Denies prior history of myocardial infarction, congestive heart failure, deep venous thrombosis, pulmonary embolism, stroke, transient ischemic attack.  ALLERGIES: No Known Allergies  MEDICATION LIST PRIOR TO VISIT: Current Outpatient Medications on File Prior to Visit  Medication Sig Dispense Refill  . alendronate (FOSAMAX) 70 MG tablet Take 70 mg by mouth once a week.    Marland Kitchen amLODipine (NORVASC) 10 MG tablet Take 10 mg by mouth daily.    Marland Kitchen aspirin EC 81 MG tablet Take 81 mg by mouth daily.    . Calcium Carbonate (CALCIUM 600 PO) Take 1 tablet by mouth 2 (two) times daily.    . cilostazol (PLETAL) 50 MG tablet TAKE 1 TABLET BY MOUTH TWICE A DAY 180 tablet 0  . Cyanocobalamin (VITAMIN B-12 PO) Take 5,000 mg by mouth daily.    . Fluticasone-Umeclidin-Vilant (TRELEGY ELLIPTA) 100-62.5-25 MCG/INH AEPB Inhale 1 puff into the lungs daily. 3 each 6  . Multiple Vitamin (MULTIVITAMIN) tablet Take 1 tablet by mouth daily.    Marland Kitchen olmesartan-hydrochlorothiazide (BENICAR HCT) 20-12.5 MG tablet Take 1 tablet by mouth daily.    . pravastatin (PRAVACHOL) 20 MG tablet Take 20 mg by mouth daily.     No current facility-administered medications on file prior to visit.    PAST MEDICAL HISTORY: Past Medical History:  Diagnosis Date  . Coronary artery calcification seen on CAT scan   . Dyspnea on exertion   . Emphysema of lung (HCC)   . Hypertension   . Nodule of right lung   . Osteoporosis   . Peripheral artery disease (HCC)     PAST SURGICAL HISTORY: Past Surgical History:  Procedure Laterality Date  . GASTRIC BYPASS      FAMILY HISTORY: The patient's family history includes Asthma in her grandchild; Cancer in her mother and paternal aunt; Heart attack in her father; Hyperlipidemia in her father; Hypertension in her brother.   SOCIAL HISTORY:  The patient  reports that she quit smoking about 14 years ago. Her smoking use included cigarettes. She started  smoking about 59 years ago. She has a 22.50 pack-year smoking history. She has never used smokeless tobacco. She reports current alcohol use. She reports that she does not use drugs.  Review of Systems  Constitutional: Negative for chills and fever.  HENT: Negative for ear discharge, ear pain and nosebleeds.   Eyes: Negative for blurred vision and discharge.  Cardiovascular: Positive for claudication and dyspnea on exertion. Negative for chest pain, leg swelling, near-syncope, orthopnea, palpitations, paroxysmal nocturnal dyspnea and syncope.  Respiratory: Negative for cough and shortness of breath.   Endocrine: Negative for polydipsia, polyphagia and polyuria.  Hematologic/Lymphatic: Negative for bleeding problem.  Skin: Negative for flushing and nail changes.  Musculoskeletal: Negative for muscle cramps, muscle weakness and myalgias.  Gastrointestinal: Negative for abdominal pain, dysphagia, hematemesis, hematochezia, melena, nausea and vomiting.  Neurological: Negative for dizziness, focal weakness and light-headedness.    PHYSICAL EXAM: Vitals with BMI 10/28/2019 10/27/2019 07/29/2019  Height 5' 2.5" 5\' 3"  5\' 2"   Weight 198 lbs 197 lbs 10 oz 196 lbs  BMI 35.62 35.01 35.84  Systolic 134 120 [id number]  Diastolic 73 62 66  Pulse 103 78 86    CONSTITUTIONAL: Well-developed and well-nourished. No acute distress.  SKIN: Skin is warm and dry. No rash noted. No cyanosis. No pallor. No jaundice HEAD: Normocephalic and atraumatic.  EYES: No scleral icterus MOUTH/THROAT: Moist oral membranes.  NECK: No JVD present. No thyromegaly noted. No carotid bruits  LYMPHATIC: No visible cervical adenopathy.  CHEST Normal respiratory effort. No intercostal retractions  LUNGS: Clear to auscultation bilaterally.  No stridor. No wheezes. No rales.  CARDIOVASCULAR: Regular rate and rhythm, positive S1-S2, no murmurs rubs or gallops appreciated. ABDOMINAL: No apparent ascites.  EXTREMITIES: No peripheral  edema. Pulses:      Femoral pulses are 2+ on the right side and 2+ on the left side.      Popliteal pulses are 2+ on the right side and 2+ on the left side.       Dorsalis pedis pulses are 1+ on the right side and 2+ on the left side.       Posterior tibial pulses are 1+ on the right side and 1+ on the left side.  HEMATOLOGIC: No significant bruising NEUROLOGIC: Oriented to person, place, and time. Nonfocal. Normal muscle tone.  PSYCHIATRIC: Normal mood and affect. Normal behavior. Cooperative  RADIOLOGY: CT of Chest 10/15/2018:  Stable 10 mm right lower lobe pulmonary nodule dating back to 2017. Compatible with a benign nodule. Stable bronchiectasis. Diffuse coronary artery disease. Aortic Atherosclerosis (ICD10-I70.0).  CARDIAC DATABASE: EKG: 06/21/2019: Normal sinus rhythm at 90 bpm, normal axis, IRBBB. No evidence of ischemia.   Echocardiogram: 02/09/2018: Left ventricle cavity is normal in size. Moderate concentric hypertrophy of the left ventricle. Normal global wall motion. Doppler evidence of grade I (impaired) diastolic dysfunction, normal LAP. Calculated EF 59%. No significant valvular abnormality. Inadequate tricuspid regurgitation jet to estimate pulmonary artery pressure. Normal right atrial pressure.   Stress Testing:  01/12/2018 Lexiscan stress test: Review of the raw data in a rotational cine format reveals breast attenuation. Left ventricular cavity  is noted to be normal on the rest and stress studies. Calculated LVEF by gated SPECT 58%.  Small sized mildly decreased intensity apical to basal inferior perfusion defect worse on rest images, likely represent breast attenuation, with imaging performed in sitting position. Ischemia in this region cannot be excluded. Low risk study.  Vascular imaging: 02/09/2018: This exam reveals mildly decreased perfusion of the right lower extremity, noted at the anterior and post tibial artery level (ABI 0.96) and normal perfusion of the  left lower extremity (ABI 1.11). Mildly abnormal waveform right ankle and normal left ankle. Study suggests mild PAD.  Lower Extremity Arterial Duplex 07/06/2019: No hemodynamically significant stenoses are identified in the lower extremity arterial system.  This exam reveals normal perfusion of the right lower extremity (ABI 0.98) and mildly decreased perfusion of the left lower extremity, noted at the post tibial artery level (ABI 0.88).  Study may suggest mild diffuse small vessel disease.  LABORATORY DATA: CBC Latest Ref Rng & Units 03/11/2017  WBC 4.0 - 10.5 K/uL 5.3  Hemoglobin 12.0 - 15.0 g/dL 19.5  Hematocrit 36 - 46 % 41.8  Platelets 150 - 400 K/uL 177.0    CMP Latest Ref Rng & Units 06/24/2019 03/11/2017  Glucose 65 - 99 mg/dL 86 093(O)  BUN 8 - 27 mg/dL 17 12  Creatinine 6.71 - 1.00 mg/dL 2.45(Y) 0.99  Sodium 833 - 144 mmol/L 143 141  Potassium 3.5 - 5.2 mmol/L 4.8 3.9  Chloride 96 - 106 mmol/L 104 105  CO2 20 - 29 mmol/L 24 29  Calcium 8.7 - 10.3 mg/dL 9.5 9.4  Total Protein 6.0 - 8.5 g/dL 6.8 7.1  Total Bilirubin 0.0 - 1.2 mg/dL 0.5 0.6  Alkaline Phos 39 - 117 IU/L 61 62  AST 0 - 40 IU/L 24 18  ALT 0 - 32 IU/L 17 13    Lipid Panel     Component Value Date/Time   CHOL 175 06/24/2019 1035   TRIG 93 06/24/2019 1035   HDL 94 06/24/2019 1035   CHOLHDL 1.9 06/24/2019 1035   LDLCALC 65 06/24/2019 1035   LABVLDL 16 06/24/2019 1035   Cardiac Panel (last 3 results) No results for input(s): CKTOTAL, CKMB, TROPONINIHS, RELINDX in the last 72 hours.  FINAL MEDICATION LIST END OF ENCOUNTER: No orders of the defined types were placed in this encounter.   There are no discontinued medications.   Current Outpatient Medications:  .  alendronate (FOSAMAX) 70 MG tablet, Take 70 mg by mouth once a week., Disp: , Rfl:  .  amLODipine (NORVASC) 10 MG tablet, Take 10 mg by mouth daily., Disp: , Rfl:  .  aspirin EC 81 MG tablet, Take 81 mg by mouth daily., Disp: , Rfl:  .   Calcium Carbonate (CALCIUM 600 PO), Take 1 tablet by mouth 2 (two) times daily., Disp: , Rfl:  .  cilostazol (PLETAL) 50 MG tablet, TAKE 1 TABLET BY MOUTH TWICE A DAY, Disp: 180 tablet, Rfl: 0 .  Cyanocobalamin (VITAMIN B-12 PO), Take 5,000 mg by mouth daily., Disp: , Rfl:  .  Fluticasone-Umeclidin-Vilant (TRELEGY ELLIPTA) 100-62.5-25 MCG/INH AEPB, Inhale 1 puff into the lungs daily., Disp: 3 each, Rfl: 6 .  Multiple Vitamin (MULTIVITAMIN) tablet, Take 1 tablet by mouth daily., Disp: , Rfl:  .  olmesartan-hydrochlorothiazide (BENICAR HCT) 20-12.5 MG tablet, Take 1 tablet by mouth daily., Disp: , Rfl:  .  pravastatin (PRAVACHOL) 20 MG tablet, Take 20 mg by mouth daily., Disp: , Rfl:   IMPRESSION:  ICD-10-CM   1. PAD (peripheral artery disease) (HCC)  I73.9   2. Pain of right lower extremity  M79.604   3. Coronary artery calcification seen on CT scan  I25.10   4. Essential hypertension  I10   5. Former smoker  Z87.891      RECOMMENDATIONS: Carla Russell is a 74 y.o. female whose past medical history and cardiovascular risk factors include: hypertension, emphysema, PAD,coronary artery calcification, postmenopausal female, advanced age.  Claudication with abnormal ABI suggestive of peripheral artery disease:  Patient is a former smoker, she understands importance of complete smoking cessation.  Continue cilostazol.  Continue aspirin.  Continue statin therapy.  Encouraged to continue participating in physical activity as tolerated.  Given the discordance between her symptoms and the ABI findings recommended follow-up with primary care physician to also evaluate non-PAD causes (I.e. osteoarthritis of the knee).  I personally spoke to the patient's primary care office to set up an appointment on July 8 11:20 AM.   Patient will be scheduled for abdominal aortic runoff.  Reviewed the risks, benefits, and alternatives patient voiced understanding would like to proceed.  Coronary artery  calcification on CT scan:  Patient does not have any active angina.  Patient had a nuclear stress test in 2019, results noted above.  Continue statin therapy.  Continue aspirin.  Benign essential hypertension: Blood pressure within acceptable range.  Medications reconciled.  Currently managed by primary team.  Former smoker: Educated on the importance of continued smoking cessation.  No orders of the defined types were placed in this encounter.  --Continue cardiac medications as reconciled in final medication list. --Return in about 6 weeks (around 12/09/2019), or after AARO, for PAD re-evaluation . Or sooner if needed. --Continue follow-up with your primary care physician regarding the management of your other chronic comorbid conditions.  Patient's questions and concerns were addressed to her satisfaction. She voices understanding of the instructions provided during this encounter.   This note was created using a voice recognition software as a result there may be grammatical errors inadvertently enclosed that do not reflect the nature of this encounter. Every attempt is made to correct such errors.  Tessa Lerner, [place], [facility]  Pager: [phone] Office: [phone]

## 2019-10-28 NOTE — Patient Instructions (Addendum)
Follow up your PCP on July 8th 1120am. Spoke to [place] at her PCP office.

## 2019-11-02 ENCOUNTER — Other Ambulatory Visit: Payer: Self-pay

## 2019-11-02 DIAGNOSIS — I2510 Atherosclerotic heart disease of native coronary artery without angina pectoris: Secondary | ICD-10-CM

## 2019-11-02 DIAGNOSIS — I10 Essential (primary) hypertension: Secondary | ICD-10-CM

## 2019-11-03 LAB — CBC WITH DIFFERENTIAL
Basophils Absolute: 0 10*3/uL (ref 0.0–0.2)
Basos: 1 %
EOS (ABSOLUTE): 0.2 10*3/uL (ref 0.0–0.4)
Eos: 4 %
Hematocrit: 37.2 % (ref 34.0–46.6)
Hemoglobin: 12 g/dL (ref 11.1–15.9)
Immature Grans (Abs): 0 10*3/uL (ref 0.0–0.1)
Immature Granulocytes: 0 %
Lymphocytes Absolute: 1.3 10*3/uL (ref 0.7–3.1)
Lymphs: 31 %
MCH: 29.3 pg (ref 26.6–33.0)
MCHC: 32.3 g/dL (ref 31.5–35.7)
MCV: 91 fL (ref 79–97)
Monocytes Absolute: 0.4 10*3/uL (ref 0.1–0.9)
Monocytes: 10 %
Neutrophils Absolute: 2.4 10*3/uL (ref 1.4–7.0)
Neutrophils: 54 %
RBC: 4.1 x10E6/uL (ref 3.77–5.28)
RDW: 12 % (ref 11.7–15.4)
WBC: 4.3 10*3/uL (ref 3.4–10.8)

## 2019-11-03 LAB — BASIC METABOLIC PANEL
BUN/Creatinine Ratio: 22 (ref 12–28)
BUN: 22 mg/dL (ref 8–27)
CO2: 22 mmol/L (ref 20–29)
Calcium: 9.7 mg/dL (ref 8.7–10.3)
Chloride: 102 mmol/L (ref 96–106)
Creatinine, Ser: 1.01 mg/dL — ABNORMAL HIGH (ref 0.57–1.00)
GFR calc Af Amer: 62 mL/min/{1.73_m2} (ref >59)
GFR calc non Af Amer: 54 mL/min/{1.73_m2} — ABNORMAL LOW (ref >59)
Glucose: 91 mg/dL (ref 65–99)
Potassium: 4.6 mmol/L (ref 3.5–5.2)
Sodium: 139 mmol/L (ref 134–144)

## 2019-11-10 ENCOUNTER — Telehealth: Payer: Self-pay

## 2019-11-10 NOTE — Telephone Encounter (Signed)
Lmom for pt to r/s f/u w/Dr Odis Hollingshead

## 2019-11-10 NOTE — Telephone Encounter (Signed)
FYI:  Carla Russell came into office today to cx her PV Angio & f/u appt. She cx'd because she is scheduled to have a procedure w/[name]

## 2019-11-16 ENCOUNTER — Ambulatory Visit (HOSPITAL_COMMUNITY): Admission: RE | Admit: 2019-11-16 | Payer: Medicare Other | Source: Home / Self Care | Admitting: Cardiology

## 2019-11-16 ENCOUNTER — Encounter (HOSPITAL_COMMUNITY): Admission: RE | Payer: Self-pay | Source: Home / Self Care

## 2019-11-16 ENCOUNTER — Other Ambulatory Visit: Payer: Self-pay | Admitting: Cardiology

## 2019-11-16 SURGERY — ABDOMINAL AORTOGRAM W/LOWER EXTREMITY
Anesthesia: LOCAL

## 2019-11-19 NOTE — Telephone Encounter (Signed)
2nd VM left for her to schedule f/u appt

## 2019-11-19 NOTE — Telephone Encounter (Signed)
Scheduled for 12/01/2019

## 2019-11-26 ENCOUNTER — Ambulatory Visit: Payer: BLUE CROSS/BLUE SHIELD | Admitting: Cardiology

## 2019-12-01 ENCOUNTER — Ambulatory Visit: Payer: Medicare Other | Admitting: Cardiology

## 2019-12-01 ENCOUNTER — Other Ambulatory Visit: Payer: Self-pay

## 2019-12-01 ENCOUNTER — Encounter: Payer: Self-pay | Admitting: Cardiology

## 2019-12-01 VITALS — BP 129/63 | HR 99 | Resp 16 | Ht 62.0 in | Wt 198.0 lb

## 2019-12-01 DIAGNOSIS — I10 Essential (primary) hypertension: Secondary | ICD-10-CM

## 2019-12-01 DIAGNOSIS — I2510 Atherosclerotic heart disease of native coronary artery without angina pectoris: Secondary | ICD-10-CM

## 2019-12-01 DIAGNOSIS — I739 Peripheral vascular disease, unspecified: Secondary | ICD-10-CM

## 2019-12-01 DIAGNOSIS — R0609 Other forms of dyspnea: Secondary | ICD-10-CM

## 2019-12-01 DIAGNOSIS — Z87891 Personal history of nicotine dependence: Secondary | ICD-10-CM

## 2019-12-01 NOTE — Progress Notes (Signed)
Carla Russell Date of Birth: 1945/11/21 MRN: [id number] Primary Care Provider:Smothers, Cathleen Corti, NP  Date: 12/01/19 Last Office Visit: 10/28/2019  Chief Complaint  Patient presents with  . Follow-up    1 month  . PAD    HPI  Carla Russell is a 74 y.o.  female who presents to the office with a chief complaint of " peripheral artery disease follow-up." Patient's past medical history and cardiovascular risk factors include: hypertension, emphysema, PAD,coronary artery calcification, postmenopausal female, advanced age.  Prior to establishing care with myself patient was diagnosed with peripheral artery disease and was already started on cilostazol.  Patient symptoms have improved since then and does not have any recurrence of claudication.  However, the last office visit patient noted that she has symptoms of right knee/calf pain that occurs as soon as she gets up from a seated position and begins to walk.  There is concerns of her having osteoarthritis and she was asked to follow-up with her PCP and possible orthopedic if needed.  Patient states that since last office visit she has been diagnosed with osteoarthritis of the knee and is currently following up with PCP and orthopedics.  Patient does not have recurrence of claudication.  Her most recent lower extremity duplex noted mild peripheral vascular disease in the left lower extremity compared to the right due to abnormal ABI.  In the arterial duplex of the lower extremity did not demonstrate any obstructive PAD.  At the last office visit patient was asked to treat her disease medically unless if she is having symptoms refractory to medical therapy.  Patient states that overall she is doing well without progression of symptoms.  She denies any chest pain or shortness of breath at rest or with effort related activities.  She has chronic dyspnea most likely with going up flights of stairs which she is contributing to overall  deconditioning.  No recent hospitalizations or urgent care visits for cardiovascular symptoms.  Denies prior history of myocardial infarction, congestive heart failure, deep venous thrombosis, pulmonary embolism, stroke, transient ischemic attack.  ALLERGIES: No Known Allergies  MEDICATION LIST PRIOR TO VISIT: Current Outpatient Medications on File Prior to Visit  Medication Sig Dispense Refill  . alendronate (FOSAMAX) 70 MG tablet Take 70 mg by mouth every Saturday.     Marland Kitchen amLODipine (NORVASC) 10 MG tablet Take 10 mg by mouth daily.    Marland Kitchen aspirin EC 81 MG tablet Take 81 mg by mouth daily.    . Calcium Citrate-Vitamin D (CALCIUM + D PO) Take 1 tablet by mouth in the morning and at bedtime.     . cilostazol (PLETAL) 50 MG tablet TAKE 1 TABLET BY MOUTH TWICE A DAY 180 tablet 0  . Fluticasone-Umeclidin-Vilant (TRELEGY ELLIPTA) 100-62.5-25 MCG/INH AEPB Inhale 1 puff into the lungs daily. (Patient taking differently: Inhale 1 puff into the lungs as needed. ) 3 each 6  . Multiple Vitamin (MULTIVITAMIN) tablet Take 1 tablet by mouth daily.    Marland Kitchen olmesartan-hydrochlorothiazide (BENICAR HCT) 20-12.5 MG tablet Take 1 tablet by mouth at bedtime.     . pravastatin (PRAVACHOL) 20 MG tablet Take 20 mg by mouth daily.    . vitamin B-12 (CYANOCOBALAMIN) 1000 MCG tablet Take 1,000 mcg by mouth daily.     No current facility-administered medications on file prior to visit.    PAST MEDICAL HISTORY: Past Medical History:  Diagnosis Date  . Coronary artery calcification seen on CAT scan   . Dyspnea on exertion   .  Emphysema of lung (HCC)   . Hypertension   . Nodule of right lung   . Osteoporosis   . Peripheral artery disease (HCC)     PAST SURGICAL HISTORY: Past Surgical History:  Procedure Laterality Date  . GASTRIC BYPASS      FAMILY HISTORY: The patient's family history includes Asthma in her grandchild; Cancer in her mother and paternal aunt; Heart attack in her father; Hyperlipidemia in her  father; Hypertension in her brother.   SOCIAL HISTORY:  The patient  reports that she quit smoking about 14 years ago. Her smoking use included cigarettes. She started smoking about 59 years ago. She has a 22.50 pack-year smoking history. She has never used smokeless tobacco. She reports current alcohol use. She reports that she does not use drugs.  Review of Systems  Constitutional: Negative for chills and fever.  HENT: Negative for ear discharge, ear pain and nosebleeds.   Eyes: Negative for blurred vision and discharge.  Cardiovascular: Positive for claudication and dyspnea on exertion. Negative for chest pain, leg swelling, near-syncope, orthopnea, palpitations, paroxysmal nocturnal dyspnea and syncope.  Respiratory: Negative for cough and shortness of breath.   Endocrine: Negative for polydipsia, polyphagia and polyuria.  Hematologic/Lymphatic: Negative for bleeding problem.  Skin: Negative for flushing and nail changes.  Musculoskeletal: Negative for muscle cramps, muscle weakness and myalgias.  Gastrointestinal: Negative for abdominal pain, dysphagia, hematemesis, hematochezia, melena, nausea and vomiting.  Neurological: Negative for dizziness, focal weakness and light-headedness.    PHYSICAL EXAM: Vitals with BMI 12/01/2019 10/28/2019 10/27/2019  Height 5\' 2"  5' 2.5" 5\' 3"   Weight 198 lbs 198 lbs 197 lbs 10 oz  BMI 36.21 35.62 35.01  Systolic 129 134 161120  Diastolic 63 73 62  Pulse 99 103 78    CONSTITUTIONAL: Well-developed and well-nourished. No acute distress.  SKIN: Skin is warm and dry. No rash noted. No cyanosis. No pallor. No jaundice HEAD: Normocephalic and atraumatic.  EYES: No scleral icterus MOUTH/THROAT: Moist oral membranes.  NECK: No JVD present. No thyromegaly noted. No carotid bruits  LYMPHATIC: No visible cervical adenopathy.  CHEST Normal respiratory effort. No intercostal retractions  LUNGS: Clear to auscultation bilaterally.  No stridor. No wheezes. No  rales.  CARDIOVASCULAR: Regular rate and rhythm, positive S1-S2, no murmurs rubs or gallops appreciated. ABDOMINAL: No apparent ascites.  EXTREMITIES: No peripheral edema. Pulses:      Femoral pulses are 2+ on the right side and 2+ on the left side.      Popliteal pulses are 2+ on the right side and 2+ on the left side.       Dorsalis pedis pulses are 1+ on the right side and 2+ on the left side.       Posterior tibial pulses are 1+ on the right side and 1+ on the left side.  HEMATOLOGIC: No significant bruising NEUROLOGIC: Oriented to person, place, and time. Nonfocal. Normal muscle tone.  PSYCHIATRIC: Normal mood and affect. Normal behavior. Cooperative  RADIOLOGY: CT of Chest 10/15/2018:  Stable 10 mm right lower lobe pulmonary nodule dating back to 2017. Compatible with a benign nodule. Stable bronchiectasis. Diffuse coronary artery disease. Aortic Atherosclerosis (ICD10-I70.0).  CARDIAC DATABASE: EKG: 12/01/2019: Normal sinus rhythm, 97 bpm, normal axis, consider old anteroseptal infarct, without underlying ischemia or injury pattern.  No significant change compared to prior ECG.  Echocardiogram: 02/09/2018: Left ventricle cavity is normal in size. Moderate concentric hypertrophy of the left ventricle. Normal global wall motion. Doppler evidence of grade I (impaired) diastolic  dysfunction, normal LAP. Calculated EF 59%. No significant valvular abnormality. Inadequate tricuspid regurgitation jet to estimate pulmonary artery pressure. Normal right atrial pressure.   Stress Testing:  01/12/2018 Lexiscan stress test: Review of the raw data in a rotational cine format reveals breast attenuation. Left ventricular cavity is noted to be normal on the rest and stress studies. Calculated LVEF by gated SPECT 58%.  Small sized mildly decreased intensity apical to basal inferior perfusion defect worse on rest images, likely represent breast attenuation, with imaging performed in sitting position.  Ischemia in this region cannot be excluded. Low risk study.  Vascular imaging: 02/09/2018: This exam reveals mildly decreased perfusion of the right lower extremity, noted at the anterior and post tibial artery level (ABI 0.96) and normal perfusion of the left lower extremity (ABI 1.11). Mildly abnormal waveform right ankle and normal left ankle. Study suggests mild PAD.  Lower Extremity Arterial Duplex 07/06/2019: No hemodynamically significant stenoses are identified in the lower extremity arterial system.  This exam reveals normal perfusion of the right lower extremity (ABI 0.98) and mildly decreased perfusion of the left lower extremity, noted at the post tibial artery level (ABI 0.88).  Study may suggest mild diffuse small vessel disease.  LABORATORY DATA: CBC Latest Ref Rng & Units 11/02/2019 03/11/2017  WBC 3.4 - 10.8 x10E3/uL 4.3 5.3  Hemoglobin 11.1 - 15.9 g/dL 63.1 49.7  Hematocrit 02.6 - 46.6 % 37.2 41.8  Platelets 150 - 400 K/uL - 177.0    CMP Latest Ref Rng & Units 11/02/2019 06/24/2019 03/11/2017  Glucose 65 - 99 mg/dL 91 86 378(H)  BUN 8 - 27 mg/dL 22 17 12   Creatinine 0.57 - 1.00 mg/dL ) 8.85(O) 2.77(A  Sodium 134 - 144 mmol/L 139 143 141  Potassium 3.5 - 5.2 mmol/L 4.6 4.8 3.9  Chloride 96 - 106 mmol/L 102 104 105  CO2 20 - 29 mmol/L 22 24 29   Calcium 8.7 - 10.3 mg/dL 9.7 9.5 9.4  Total Protein 6.0 - 8.5 g/dL - 6.8 7.1  Total Bilirubin 0.0 - 1.2 mg/dL - 0.5 0.6  Alkaline Phos 39 - 117 IU/L - 61 62  AST 0 - 40 IU/L - 24 18  ALT 0 - 32 IU/L - 17 13    Lipid Panel     Component Value Date/Time   CHOL 175 06/24/2019 1035   TRIG 93 06/24/2019 1035   HDL 94 06/24/2019 1035   CHOLHDL 1.9 06/24/2019 1035   LDLCALC 65 06/24/2019 1035   LABVLDL 16 06/24/2019 1035   Cardiac Panel (last 3 results) No results for input(s): CKTOTAL, CKMB, TROPONINIHS, RELINDX in the last 72 hours.  FINAL MEDICATION LIST END OF ENCOUNTER: No orders of the defined types were placed in  this encounter.   Medications Discontinued During This Encounter  Medication Reason  . Aspirin-Caffeine (BAYER BACK & BODY PAIN EX ST) 500-32.5 MG TABS Error  . Cholecalciferol (VITAMIN D) 50 MCG (2000 UT) tablet Error     Current Outpatient Medications:  .  alendronate (FOSAMAX) 70 MG tablet, Take 70 mg by mouth every Saturday. , Disp: , Rfl:  .  amLODipine (NORVASC) 10 MG tablet, Take 10 mg by mouth daily., Disp: , Rfl:  .  aspirin EC 81 MG tablet, Take 81 mg by mouth daily., Disp: , Rfl:  .  Calcium Citrate-Vitamin D (CALCIUM + D PO), Take 1 tablet by mouth in the morning and at bedtime. , Disp: , Rfl:  .  cilostazol (PLETAL) 50 MG tablet, TAKE  1 TABLET BY MOUTH TWICE A DAY, Disp: 180 tablet, Rfl: 0 .  Fluticasone-Umeclidin-Vilant (TRELEGY ELLIPTA) 100-62.5-25 MCG/INH AEPB, Inhale 1 puff into the lungs daily. (Patient taking differently: Inhale 1 puff into the lungs as needed. ), Disp: 3 each, Rfl: 6 .  Multiple Vitamin (MULTIVITAMIN) tablet, Take 1 tablet by mouth daily., Disp: , Rfl:  .  olmesartan-hydrochlorothiazide (BENICAR HCT) 20-12.5 MG tablet, Take 1 tablet by mouth at bedtime. , Disp: , Rfl:  .  pravastatin (PRAVACHOL) 20 MG tablet, Take 20 mg by mouth daily., Disp: , Rfl:  .  vitamin B-12 (CYANOCOBALAMIN) 1000 MCG tablet, Take 1,000 mcg by mouth daily., Disp: , Rfl:   IMPRESSION:    ICD-10-CM   1. PAD (peripheral artery disease) (HCC)  I73.9   2. Coronary atherosclerosis due to calcified coronary lesion of native artery, on nongated CT 10/15/2018  I25.10 EKG 12-Lead   I25.84   3. Dyspnea on exertion  R06.00   4. Essential hypertension  I10   5. Former smoker  Z87.891      RECOMMENDATIONS: Brittni Hult is a 74 y.o. female whose past medical history and cardiovascular risk factors include: hypertension, emphysema, PAD,coronary artery calcification, postmenopausal female, advanced age.  Claudication with abnormal ABI suggestive of peripheral artery disease:  Patient is  a former smoker, she understands importance of complete smoking cessation.  Continue cilostazol.  Continue aspirin.  Continue statin therapy.  Encouraged to continue participating in physical activity as tolerated.  Since initiation of cilostazol her symptoms are overall well controlled.  We will continue medical therapy for now and follow her closely for new or worsening symptoms of claudication.  Coronary artery calcification on CT scan:  Patient does not have any active angina.  Patient had a nuclear stress test in 2019, results noted above.  Continue statin therapy.  Continue aspirin.  Dyspnea on exertion: Chronic and stable.  Benign essential hypertension: Blood pressure within acceptable range.  Medications reconciled.  Currently managed by primary team.  Former smoker: Educated on the importance of continued smoking cessation.  Orders Placed This Encounter  Procedures  . EKG 12-Lead   --Continue cardiac medications as reconciled in final medication list. --Return in about 6 months (around 06/02/2020) for re-evaluation of symptoms.. Or sooner if needed. --Continue follow-up with your primary care physician regarding the management of your other chronic comorbid conditions.  Patient's questions and concerns were addressed to her satisfaction. She voices understanding of the instructions provided during this encounter.   This note was created using a voice recognition software as a result there may be grammatical errors inadvertently enclosed that do not reflect the nature of this encounter. Every attempt is made to correct such errors.  Tessa Lerner, [place], [facility]  Pager: [phone] Office: [phone]

## 2020-02-25 ENCOUNTER — Other Ambulatory Visit: Payer: Self-pay | Admitting: Cardiology

## 2020-03-28 ENCOUNTER — Other Ambulatory Visit: Payer: Self-pay | Admitting: Cardiology

## 2020-05-17 ENCOUNTER — Other Ambulatory Visit (HOSPITAL_COMMUNITY): Payer: Self-pay | Admitting: Nurse Practitioner

## 2020-05-17 ENCOUNTER — Telehealth: Payer: Self-pay

## 2020-05-17 ENCOUNTER — Other Ambulatory Visit: Payer: Self-pay | Admitting: Nurse Practitioner

## 2020-05-17 ENCOUNTER — Ambulatory Visit: Payer: Medicare Other

## 2020-05-17 ENCOUNTER — Other Ambulatory Visit: Payer: Self-pay

## 2020-05-17 DIAGNOSIS — R52 Pain, unspecified: Secondary | ICD-10-CM

## 2020-05-17 DIAGNOSIS — R600 Localized edema: Secondary | ICD-10-CM

## 2020-05-17 DIAGNOSIS — M79605 Pain in left leg: Secondary | ICD-10-CM

## 2020-05-17 DIAGNOSIS — M79661 Pain in right lower leg: Secondary | ICD-10-CM

## 2020-05-17 DIAGNOSIS — I739 Peripheral vascular disease, unspecified: Secondary | ICD-10-CM

## 2020-05-17 DIAGNOSIS — M79604 Pain in right leg: Secondary | ICD-10-CM

## 2020-05-17 NOTE — Telephone Encounter (Signed)
Pt's doctor called about her u/s and abi done today, looking for stat results.  [facility]  Victorino Dike  [phone]

## 2020-05-18 ENCOUNTER — Encounter (HOSPITAL_COMMUNITY): Payer: Medicare Other

## 2020-05-18 ENCOUNTER — Ambulatory Visit (HOSPITAL_COMMUNITY): Payer: Medicare Other

## 2020-05-19 ENCOUNTER — Encounter (HOSPITAL_COMMUNITY): Payer: Self-pay

## 2020-05-19 ENCOUNTER — Ambulatory Visit (HOSPITAL_COMMUNITY): Payer: Medicare Other

## 2020-05-19 NOTE — Telephone Encounter (Signed)
Results are posted. Please fax the referring provider a copy of the results if not already done so.

## 2020-05-22 ENCOUNTER — Ambulatory Visit (HOSPITAL_BASED_OUTPATIENT_CLINIC_OR_DEPARTMENT_OTHER): Payer: Medicare Other

## 2020-05-23 ENCOUNTER — Other Ambulatory Visit: Payer: Self-pay

## 2020-05-23 ENCOUNTER — Encounter (HOSPITAL_COMMUNITY): Payer: Self-pay

## 2020-05-23 ENCOUNTER — Ambulatory Visit (HOSPITAL_COMMUNITY)
Admission: RE | Admit: 2020-05-23 | Discharge: 2020-05-23 | Disposition: A | Discharge status: Home / Self Care | Payer: Medicare Other | Source: Ambulatory Visit | Attending: Nurse Practitioner | Admitting: Nurse Practitioner

## 2020-05-23 DIAGNOSIS — I739 Peripheral vascular disease, unspecified: Secondary | ICD-10-CM | POA: Diagnosis present

## 2020-05-23 MED ORDER — IOHEXOL 350 MG/ML SOLN
100.0000 mL | Freq: Once | INTRAVENOUS | Status: AC | PRN
  Administered 2020-05-23: 100 mL via INTRAVENOUS

## 2020-06-02 ENCOUNTER — Other Ambulatory Visit: Payer: Self-pay

## 2020-06-02 ENCOUNTER — Encounter: Payer: Self-pay | Admitting: Cardiology

## 2020-06-02 ENCOUNTER — Ambulatory Visit: Payer: Medicare Other | Admitting: Cardiology

## 2020-06-02 VITALS — BP 131/84 | HR 82 | Temp 98.4°F | Resp 16 | Ht 62.0 in | Wt 193.8 lb

## 2020-06-02 DIAGNOSIS — M79604 Pain in right leg: Secondary | ICD-10-CM

## 2020-06-02 DIAGNOSIS — I10 Essential (primary) hypertension: Secondary | ICD-10-CM

## 2020-06-02 DIAGNOSIS — Z87891 Personal history of nicotine dependence: Secondary | ICD-10-CM

## 2020-06-02 DIAGNOSIS — I739 Peripheral vascular disease, unspecified: Secondary | ICD-10-CM

## 2020-06-02 DIAGNOSIS — I2584 Coronary atherosclerosis due to calcified coronary lesion: Secondary | ICD-10-CM

## 2020-06-02 DIAGNOSIS — I2510 Atherosclerotic heart disease of native coronary artery without angina pectoris: Secondary | ICD-10-CM

## 2020-06-02 NOTE — Progress Notes (Signed)
[name] Date of Birth: 30-Mar-1946 MRN: [id number] Primary Care Provider:Smothers, Cathleen Corti, NP  Date: 06/02/20 Last Office Visit: 12/01/2019  Chief Complaint  Patient presents with  . Follow-up    Leg pain    HPI  Carla Russell is a 75 y.o.  female who presents to the office with a chief complaint of " 25-month follow-up, leg pain" Patient's past medical history and cardiovascular risk factors include: hypertension, emphysema, PAD,coronary artery calcification, postmenopausal female, advanced age.  Prior to establishing care with myself patient was diagnosed with peripheral artery disease due to abnormal ankle-brachial index and her symptoms.  She was started on pharmacological therapy and since then has done well.  She now presents for 70-month follow-up and complains of right lower extremity discomfort.  Patient states that the pain is localized to the right calf area.  The discomfort radiates proximally up into her mid thigh and buttocks region.  Patient states that the pain is 9 out of 10, sharp-like sensation.  Most pronounced with prolonged sitting or while she gets up from a seated position to standing.  Patient does not have much discomfort once she starts ambulating.  Since last office visit patient came in for lower extremity venous and arterial duplex.  Results were reviewed with her and findings noted below for further reference.  Based on the studies patient ankle-brachial index has improved bilaterally and that she is noted to have no significant hemodynamic stenosis.  However she does have plaque within the bilateral lower extremities.  Patient states that she was recently prescribed prednisone for her discomfort and will be starting today.  She denies any chest pain or shortness of breath at rest or with effort related activities.  She has chronic dyspnea most likely with going up flights of stairs which she is contributing to overall deconditioning.  No recent  hospitalizations or urgent care visits for cardiovascular symptoms.  ALLERGIES: No Known Allergies  MEDICATION LIST PRIOR TO VISIT: Current Outpatient Medications on File Prior to Visit  Medication Sig Dispense Refill  . albuterol (VENTOLIN HFA) 108 (90 Base) MCG/ACT inhaler Inhale into the lungs.    Marland Kitchen alendronate (FOSAMAX) 70 MG tablet Take 70 mg by mouth every Saturday.     Marland Kitchen amLODipine (NORVASC) 10 MG tablet Take 10 mg by mouth daily.    Marland Kitchen aspirin EC 81 MG tablet Take 81 mg by mouth daily.    Marland Kitchen b complex vitamins capsule Take 1 capsule by mouth daily.    . Calcium Carbonate (CALCIUM-CARB 600 PO) Take by mouth.    . Calcium Citrate-Vitamin D (CALCIUM + D PO) Take 1 tablet by mouth in the morning and at bedtime.     . cilostazol (PLETAL) 50 MG tablet Take 50 mg by mouth 2 (two) times daily.    . Fluticasone-Umeclidin-Vilant (TRELEGY ELLIPTA) 100-62.5-25 MCG/INH AEPB Inhale 1 puff into the lungs daily. (Patient taking differently: Inhale 1 puff into the lungs as needed.) 3 each 6  . olmesartan-hydrochlorothiazide (BENICAR HCT) 20-12.5 MG tablet Take 1 tablet by mouth at bedtime.     . pravastatin (PRAVACHOL) 20 MG tablet Take 20 mg by mouth daily.     No current facility-administered medications on file prior to visit.    PAST MEDICAL HISTORY: Past Medical History:  Diagnosis Date  . Coronary artery calcification seen on CAT scan   . Dyspnea on exertion   . Emphysema of lung (HCC)   . Hypertension   . Nodule of right lung   .  Osteoporosis   . Peripheral artery disease (HCC)     PAST SURGICAL HISTORY: Past Surgical History:  Procedure Laterality Date  . GASTRIC BYPASS      FAMILY HISTORY: The patient's family history includes Asthma in her grandchild; Cancer in her mother and paternal aunt; Heart attack in her father; Hyperlipidemia in her father; Hypertension in her brother.   SOCIAL HISTORY:  The patient  reports that she quit smoking about 15 years ago. Her smoking use  included cigarettes. She started smoking about 59 years ago. She has a 22.50 pack-year smoking history. She has never used smokeless tobacco. She reports current alcohol use. She reports that she does not use drugs.  Review of Systems  Constitutional: Negative for chills and fever.  HENT: Negative for ear discharge, ear pain and nosebleeds.   Eyes: Negative for blurred vision and discharge.  Cardiovascular: Negative for chest pain, claudication, dyspnea on exertion, leg swelling, near-syncope, orthopnea, palpitations, paroxysmal nocturnal dyspnea and syncope.       Leg pain  Respiratory: Negative for cough and shortness of breath.   Endocrine: Negative for polydipsia, polyphagia and polyuria.  Hematologic/Lymphatic: Negative for bleeding problem.  Skin: Negative for flushing and nail changes.  Musculoskeletal: Negative for muscle cramps, muscle weakness and myalgias.  Gastrointestinal: Negative for abdominal pain, dysphagia, hematemesis, hematochezia, melena, nausea and vomiting.  Neurological: Negative for dizziness, focal weakness and light-headedness.    PHYSICAL EXAM: Vitals with BMI 06/02/2020 12/01/2019 10/28/2019  Height 5\' 2"  5\' 2"  5' 2.5"  Weight 193 lbs 13 oz 198 lbs 198 lbs  BMI 35.44 36.21 35.62  Systolic 131 129 170  Diastolic 84 63 73  Pulse 82 99 103    CONSTITUTIONAL: Well-developed and well-nourished. No acute distress.  SKIN: Skin is warm and dry. No rash noted. No cyanosis. No pallor. No jaundice HEAD: Normocephalic and atraumatic.  EYES: No scleral icterus MOUTH/THROAT: Moist oral membranes.  NECK: No JVD present. No thyromegaly noted. No carotid bruits  LYMPHATIC: No visible cervical adenopathy.  CHEST Normal respiratory effort. No intercostal retractions  LUNGS: Clear to auscultation bilaterally.  No stridor. No wheezes. No rales.  CARDIOVASCULAR: Regular rate and rhythm, positive S1-S2, no murmurs rubs or gallops appreciated. ABDOMINAL: No apparent ascites.   EXTREMITIES: No peripheral edema. Pulses:      Femoral pulses are 2+ on the right side and 2+ on the left side.      Popliteal pulses are 2+ on the right side and 2+ on the left side.       Dorsalis pedis pulses are 1+ on the right side and 2+ on the left side.       Posterior tibial pulses are 1+ on the right side and 1+ on the left side.  HEMATOLOGIC: No significant bruising NEUROLOGIC: Oriented to person, place, and time. Nonfocal. Normal muscle tone.  PSYCHIATRIC: Normal mood and affect. Normal behavior. Cooperative  RADIOLOGY: CT of Chest 10/15/2018:  Stable 10 mm right lower lobe pulmonary nodule dating back to 2017. Compatible with a benign nodule. Stable bronchiectasis. Diffuse coronary artery disease. Aortic Atherosclerosis (ICD10-I70.0).  CARDIAC DATABASE: EKG: 12/01/2019: Normal sinus rhythm, 97 bpm, normal axis, consider old anteroseptal infarct, without underlying ischemia or injury pattern.  No significant change compared to prior ECG.  Echocardiogram: 02/09/2018: Left ventricle cavity is normal in size. Moderate concentric hypertrophy of the left ventricle. Normal global wall motion. Doppler evidence of grade I (impaired) diastolic dysfunction, normal LAP. Calculated EF 59%. No significant valvular abnormality. Inadequate tricuspid regurgitation  jet to estimate pulmonary artery pressure. Normal right atrial pressure.   Stress Testing:  01/12/2018 Lexiscan stress test: Review of the raw data in a rotational cine format reveals breast attenuation. Left ventricular cavity is noted to be normal on the rest and stress studies. Calculated LVEF by gated SPECT 58%.  Small sized mildly decreased intensity apical to basal inferior perfusion defect worse on rest images, likely represent breast attenuation, with imaging performed in sitting position. Ischemia in this region cannot be excluded. Low risk study.  Vascular imaging: 02/09/2018: This exam reveals mildly decreased perfusion  of the right lower extremity, noted at the anterior and post tibial artery level (ABI 0.96) and normal perfusion of the left lower extremity (ABI 1.11). Mildly abnormal waveform right ankle and normal left ankle. Study suggests mild PAD.  Lower Extremity Arterial Duplex 05/17/2020:  No hemodynamically significant stenoses are identified in the bilateral lower extremity arterial system. Heterogeneous plaque noted in the iliac vessels and CFA.  This exam reveals normal perfusion of the right lower extremity (ABI 1.2) and normal perfusion of the left lower extremity (ABI 1.2). There is multiphasic normal waveform at the ankle. Please evaluate for psuedo-claudication.  No significant change from 07/06/2019.  Lower Extremity Venous Duplex 05/17/2020:  No evidence of deep vein thrombosis of the lower extremities with normal  venous return.  LABORATORY DATA: CBC Latest Ref Rng & Units 11/02/2019 03/11/2017  WBC 3.4 - 10.8 x10E3/uL 4.3 5.3  Hemoglobin 11.1 - 15.9 g/dL 16.1 09.6  Hematocrit 04.5 - 46.6 % 37.2 41.8  Platelets 150.0 - 400.0 K/uL - 177.0    CMP Latest Ref Rng & Units 11/02/2019 06/24/2019 03/11/2017  Glucose 65 - 99 mg/dL 91 86 409(W)  BUN 8 - 27 mg/dL 22 17 12   Creatinine 0.57 - 1.00 mg/dL ) 1.19(J) 4.78(G  Sodium 134 - 144 mmol/L 139 143 141  Potassium 3.5 - 5.2 mmol/L 4.6 4.8 3.9  Chloride 96 - 106 mmol/L 102 104 105  CO2 20 - 29 mmol/L 22 24 29   Calcium 8.7 - 10.3 mg/dL 9.7 9.5 9.4  Total Protein 6.0 - 8.5 g/dL - 6.8 7.1  Total Bilirubin 0.0 - 1.2 mg/dL - 0.5 0.6  Alkaline Phos 39 - 117 IU/L - 61 62  AST 0 - 40 IU/L - 24 18  ALT 0 - 32 IU/L - 17 13    Lipid Panel     Component Value Date/Time   CHOL 175 06/24/2019 1035   TRIG 93 06/24/2019 1035   HDL 94 06/24/2019 1035   CHOLHDL 1.9 06/24/2019 1035   LDLCALC 65 06/24/2019 1035   LABVLDL 16 06/24/2019 1035   Cardiac Panel (last 3 results) No results for input(s): CKTOTAL, CKMB, TROPONINIHS, RELINDX in the last 72  hours.  FINAL MEDICATION LIST END OF ENCOUNTER: No orders of the defined types were placed in this encounter.   Medications Discontinued During This Encounter  Medication Reason  . vitamin B-12 (CYANOCOBALAMIN) 1000 MCG tablet Error  . Multiple Vitamin (MULTIVITAMIN) tablet Error  . cilostazol (PLETAL) 50 MG tablet Error     Current Outpatient Medications:  .  albuterol (VENTOLIN HFA) 108 (90 Base) MCG/ACT inhaler, Inhale into the lungs., Disp: , Rfl:  .  alendronate (FOSAMAX) 70 MG tablet, Take 70 mg by mouth every Saturday. , Disp: , Rfl:  .  amLODipine (NORVASC) 10 MG tablet, Take 10 mg by mouth daily., Disp: , Rfl:  .  aspirin EC 81 MG tablet, Take 81 mg by mouth  daily., Disp: , Rfl:  .  b complex vitamins capsule, Take 1 capsule by mouth daily., Disp: , Rfl:  .  Calcium Carbonate (CALCIUM-CARB 600 PO), Take by mouth., Disp: , Rfl:  .  Calcium Citrate-Vitamin D (CALCIUM + D PO), Take 1 tablet by mouth in the morning and at bedtime. , Disp: , Rfl:  .  cilostazol (PLETAL) 50 MG tablet, Take 50 mg by mouth 2 (two) times daily., Disp: , Rfl:  .  Fluticasone-Umeclidin-Vilant (TRELEGY ELLIPTA) 100-62.5-25 MCG/INH AEPB, Inhale 1 puff into the lungs daily. (Patient taking differently: Inhale 1 puff into the lungs as needed.), Disp: 3 each, Rfl: 6 .  olmesartan-hydrochlorothiazide (BENICAR HCT) 20-12.5 MG tablet, Take 1 tablet by mouth at bedtime. , Disp: , Rfl:  .  pravastatin (PRAVACHOL) 20 MG tablet, Take 20 mg by mouth daily., Disp: , Rfl:   IMPRESSION:    ICD-10-CM   1. Pain of right lower extremity  M79.604   2. PAD (peripheral artery disease) (HCC)  I73.9 EKG 12-Lead  3. Coronary atherosclerosis due to calcified coronary lesion of native artery, on nongated CT 10/15/2018  I25.10    I25.84   4. Essential hypertension  I10   5. Former smoker  Z87.891      RECOMMENDATIONS: Carla Russell is a 75 y.o. female whose past medical history and cardiovascular risk factors include:  hypertension, emphysema, PAD,coronary artery calcification, postmenopausal female, advanced age.  Right lower extremity pain:  Since last office visit patient continues to have right lower extremity pain.  However based on his symptomatology this is not classic claudication.  Since last office visit she has undergone lower extremity arterial duplex.  Based on the report patient's ankle-brachial index are within normal limits bilaterally which has improved compared to prior studies.  No hemodynamically significant stenosis noted arterial duplex; however, she does have plaque noted in the iliacs as well as the common femoral arteries.  I suspect that the pain is most likely secondary to pseudoclaudication and nonvascular etiologies should be further investigated by her PCP and care team.  Hx of claudication with abnormal ABI suggestive of peripheral artery disease:  Recent LE arterial duplex notes normal ABI bilaterally with no hemodynamically significant stenosis in either lower extremity.  There is hydrogenous plaque noted within the iliac vessels and the common femoral artery.  Patient is a former smoker, she understands importance of complete smoking cessation.  May continue cilostazol, aspirin, statin therapy.  Encouraged to continue participating in physical activity as tolerated.  Coronary artery calcification on CT scan:  Patient does not have any active angina.  Her last ischemic evaluation was back in 2019.  Continue aspirin and statin therapy.  Benign essential hypertension: Blood pressure within acceptable range.  Medications reconciled.  Currently managed by primary team.  Former smoker: Educated on the importance of continued smoking cessation.  Orders Placed This Encounter  Procedures  . EKG 12-Lead   --Continue cardiac medications as reconciled in final medication list. --Return in about 6 months (around 11/30/2020) for Follow up, Coronary artery calcification. Or sooner  if needed. --Continue follow-up with your primary care physician regarding the management of your other chronic comorbid conditions.  Patient's questions and concerns were addressed to her satisfaction. She voices understanding of the instructions provided during this encounter.   This note was created using a voice recognition software as a result there may be grammatical errors inadvertently enclosed that do not reflect the nature of this encounter. Every attempt is made to correct  such errors.  Rex Kras, [place], [facility]  Pager: [phone] Office: [phone]

## 2020-07-22 IMAGING — DX DG CHEST 2V
2 series · 2 of 2 positions shown · non-contrast
Comparison: Chest x-ray 03/11/2017.

CLINICAL DATA: 74-year-old female with history of COPD.

EXAM:
CHEST - 2 VIEW

[chest pa]
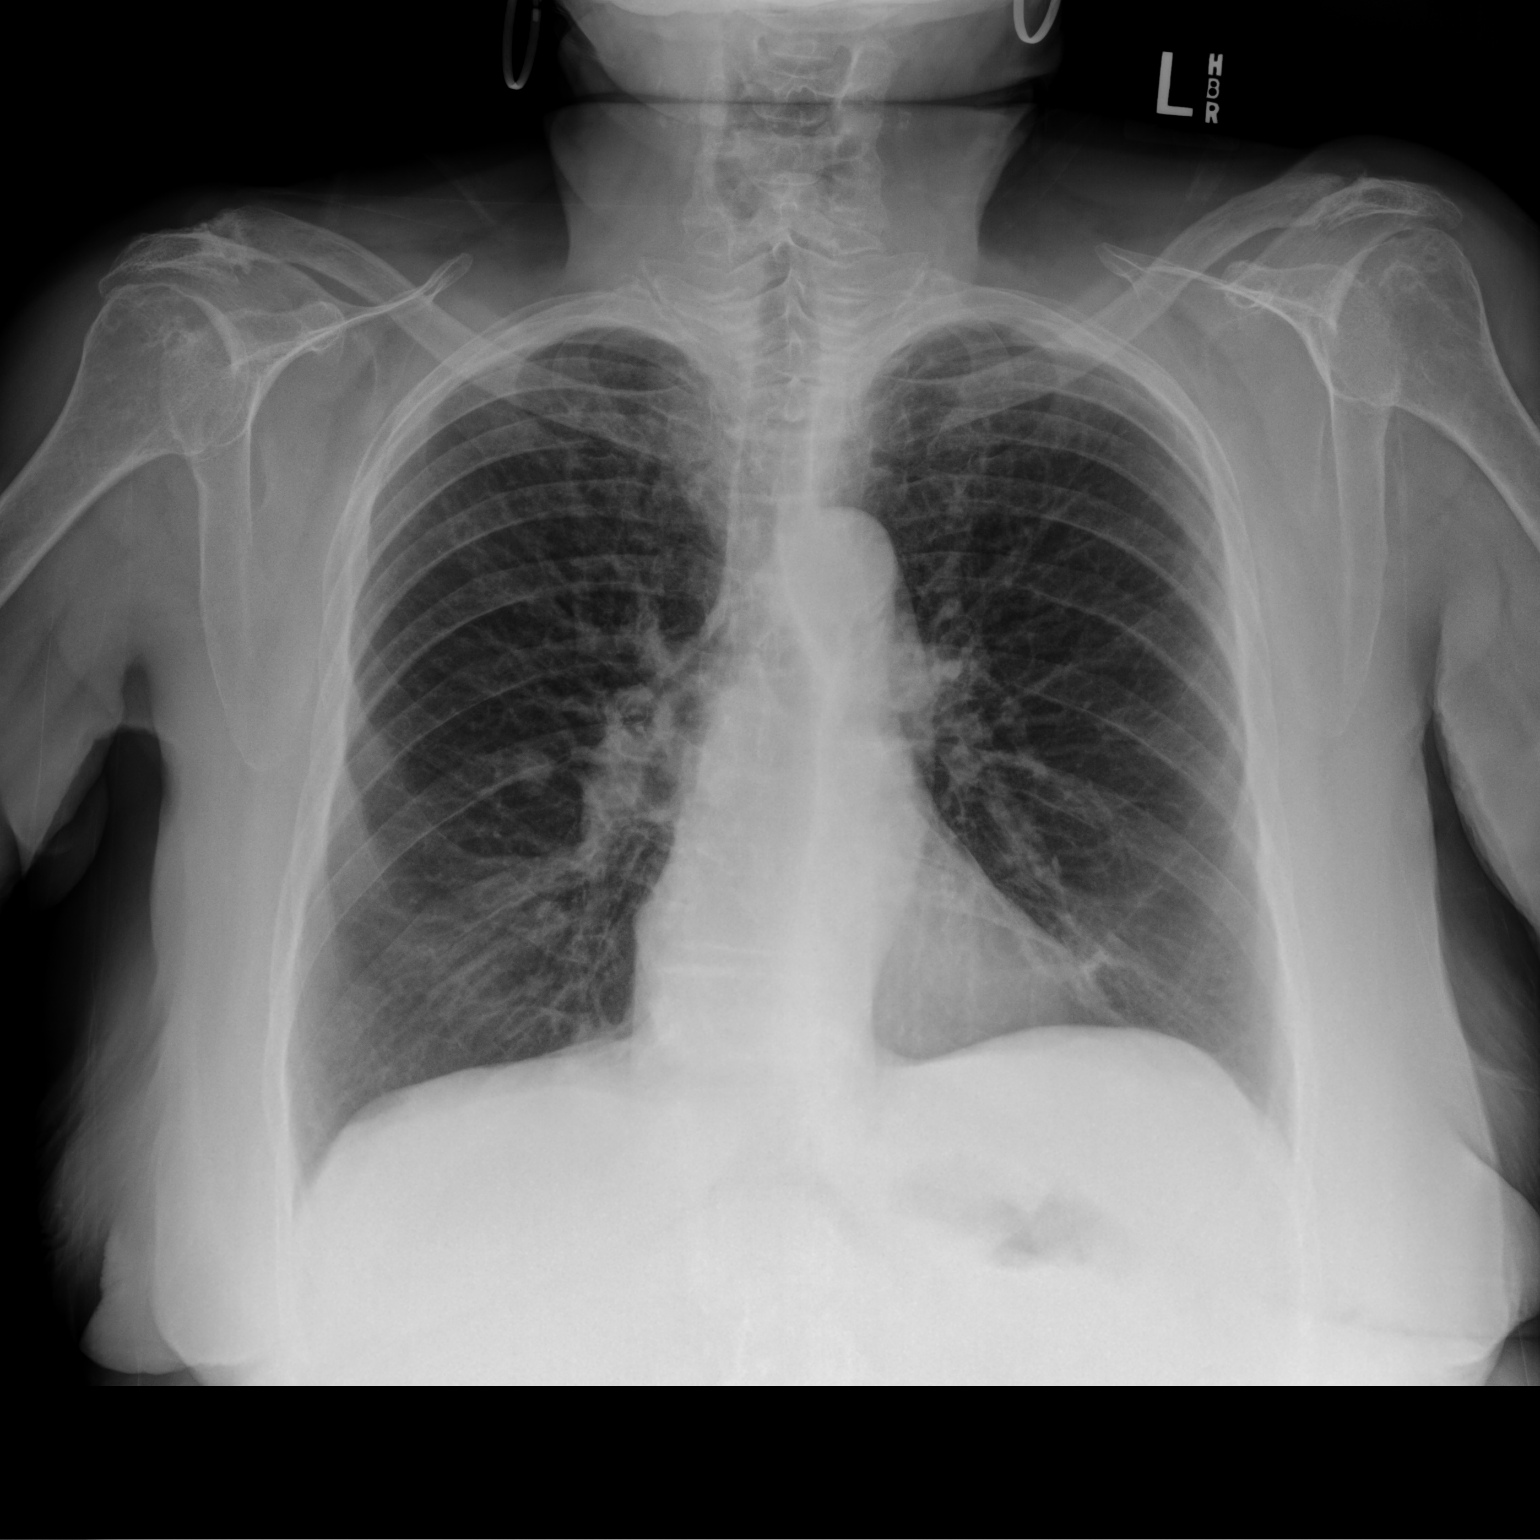

[chest lat]
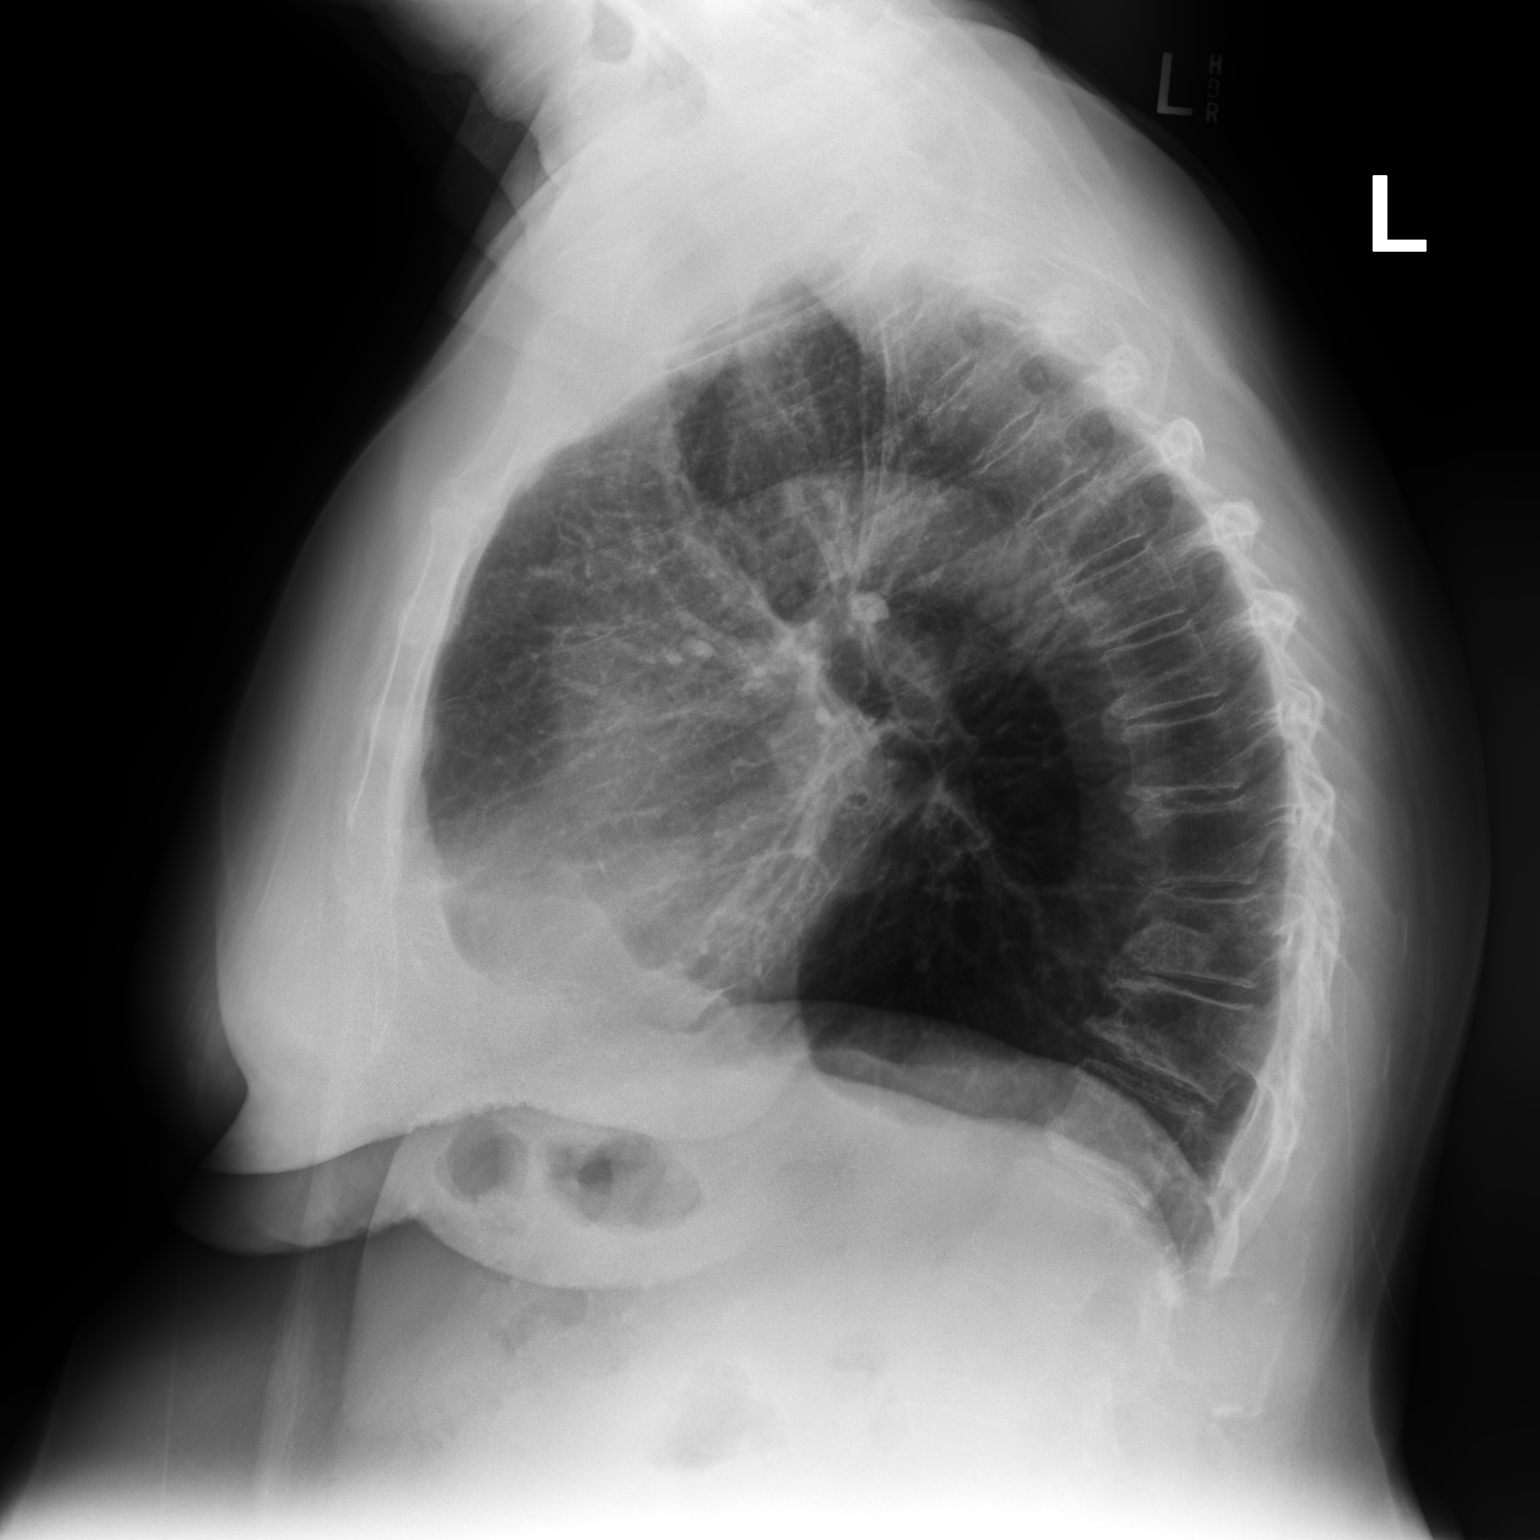

[2 of 2 positions shown; findings below may reference images not displayed]

FINDINGS: 1 cm right lower lobe nodule, stable compared to numerous prior
examinations, considered benign on prior chest CTs. Emphysematous
changes. Mild scarring in the lingula, similar to prior studies. No
acute consolidative airspace disease. No pleural effusions. No
pneumothorax. No evidence of pulmonary edema. Heart size is normal.
Upper mediastinal contours are within normal limits. Aortic
atherosclerosis.
IMPRESSION: 1. No radiographic evidence of acute cardiopulmonary disease.
2. Emphysema.
3. Aortic atherosclerosis.
4. Stable right lower lobe pulmonary nodule, considered benign.

## 2020-08-19 ENCOUNTER — Other Ambulatory Visit: Payer: Self-pay | Admitting: Cardiology

## 2020-09-19 ENCOUNTER — Other Ambulatory Visit: Payer: Self-pay | Admitting: Cardiology

## 2020-11-29 ENCOUNTER — Ambulatory Visit: Payer: Medicare Other | Admitting: Pulmonary Disease

## 2020-12-01 ENCOUNTER — Ambulatory Visit: Payer: BLUE CROSS/BLUE SHIELD | Admitting: Cardiology

## 2021-02-16 IMAGING — CT CT ANGIO CHEST
2 of 7 series · 19 of 46 positions shown · IV contrast (APPLIED)
Comparison: 10/05/2018

CLINICAL DATA: Shortness of breath, positive D-dimer

EXAM:
CT ANGIOGRAPHY CHEST WITH CONTRAST
TECHNIQUE: Multidetector CT imaging of the chest was performed using the
standard protocol during bolus administration of intravenous
contrast. Multiplanar CT image reconstructions and MIPs were
obtained to evaluate the vascular anatomy.
CONTRAST:  85 mL OMNIPAQUE IOHEXOL 350 MG/ML SOLN

[Series 5: thins · axial · 0.98mm/px · z∈[+1391,+1697]mm · 17 of 346 slices shown]
[im 20/346  lung]
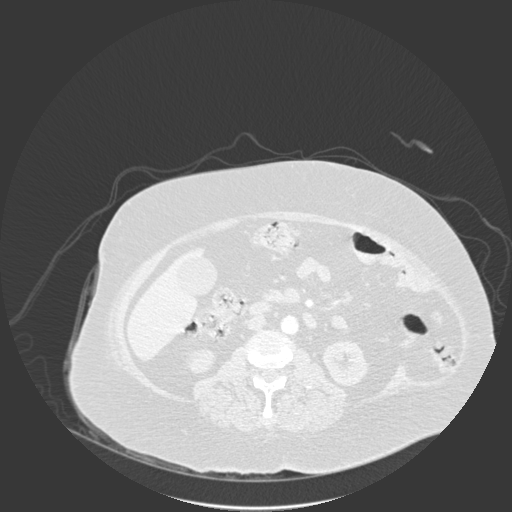
[im 39/346  soft-tissue]
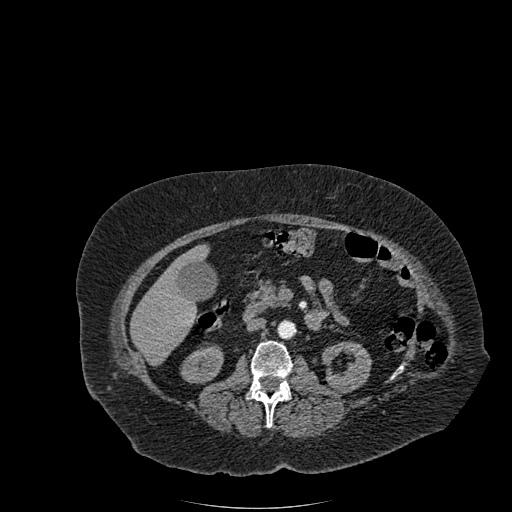
[im 58/346  lung]
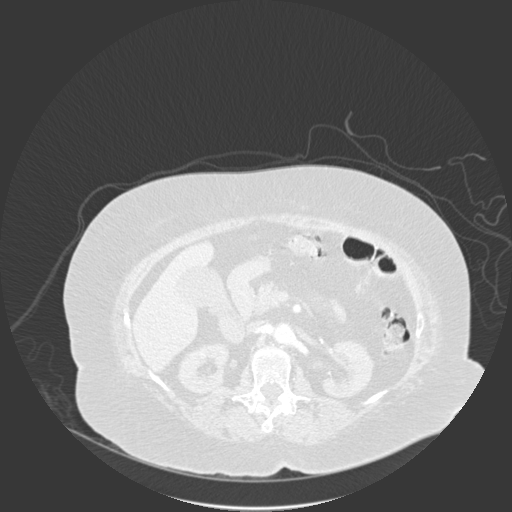
[im 77/346  soft-tissue]
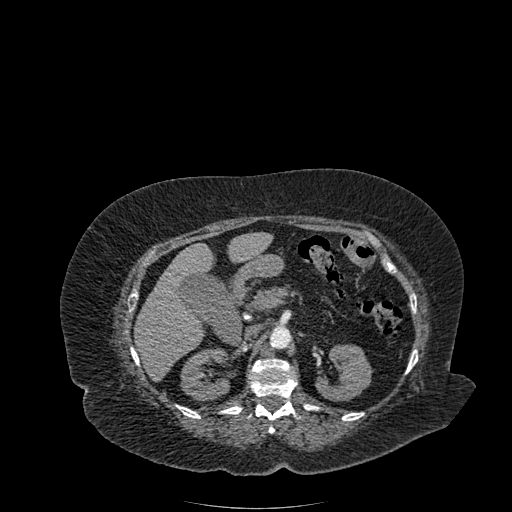
[im 96/346  lung]
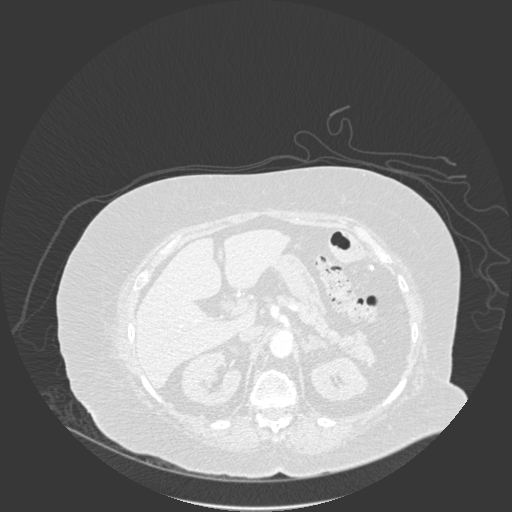
[im 116/346  soft-tissue]
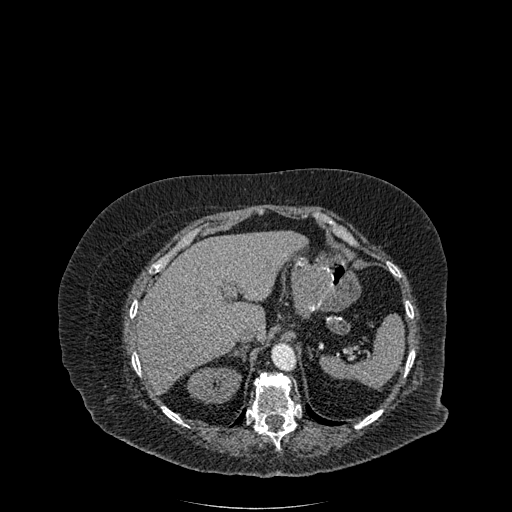
[im 135/346  lung]
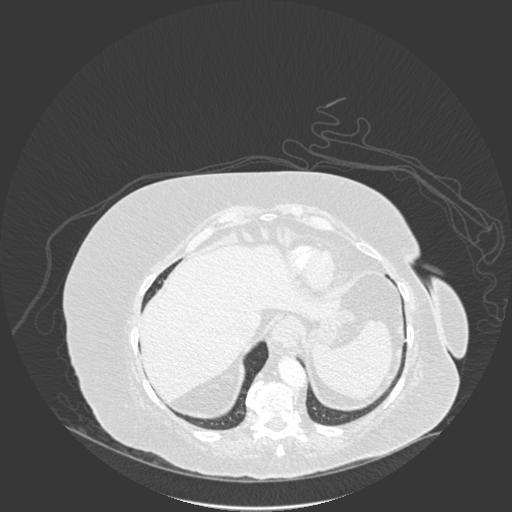
[im 154/346  soft-tissue]
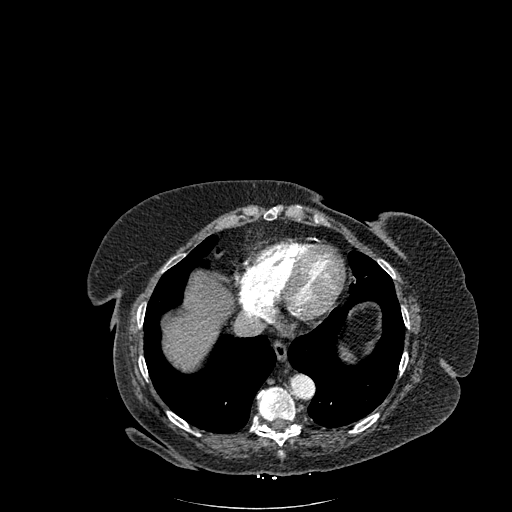
[im 173/346  lung]
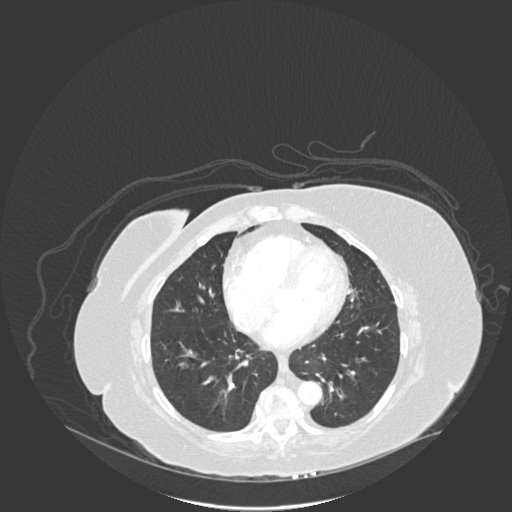
[im 192/346  soft-tissue]
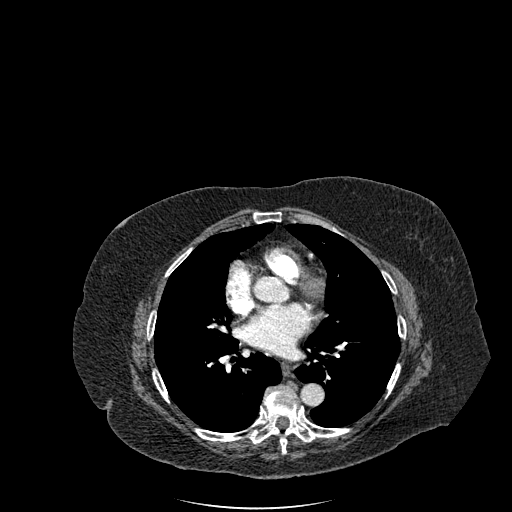
[im 211/346  lung]
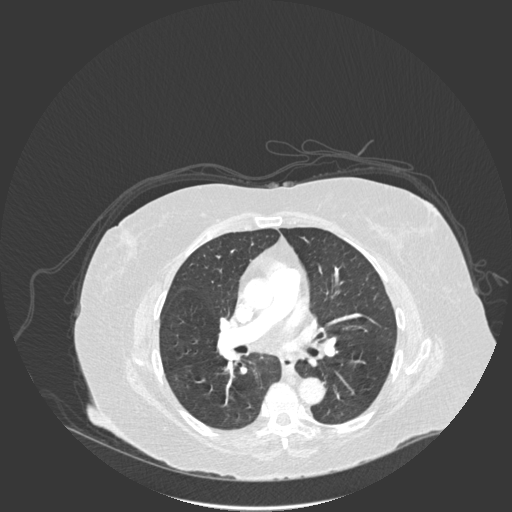
[im 231/346  soft-tissue]
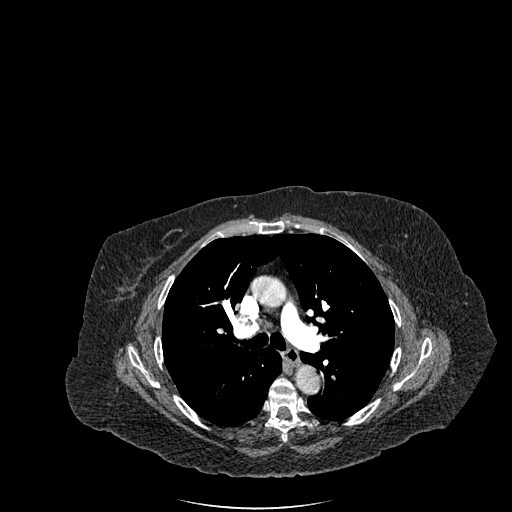
[im 250/346  lung]
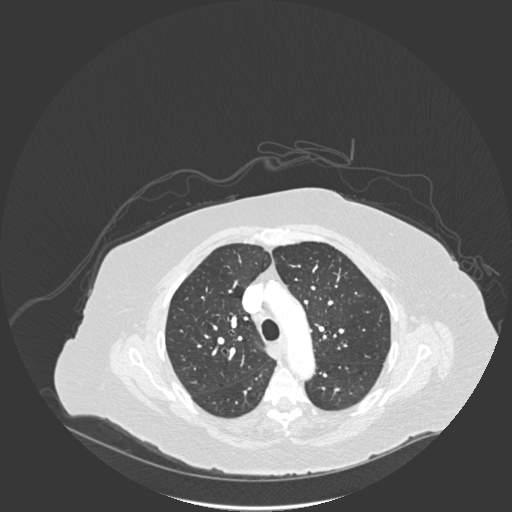
[im 269/346  soft-tissue]
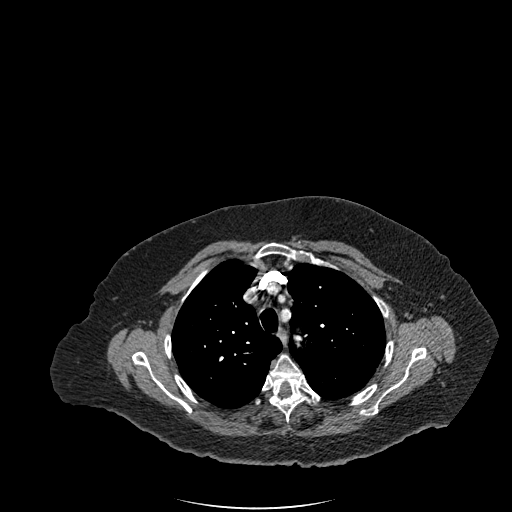
[im 288/346  lung]
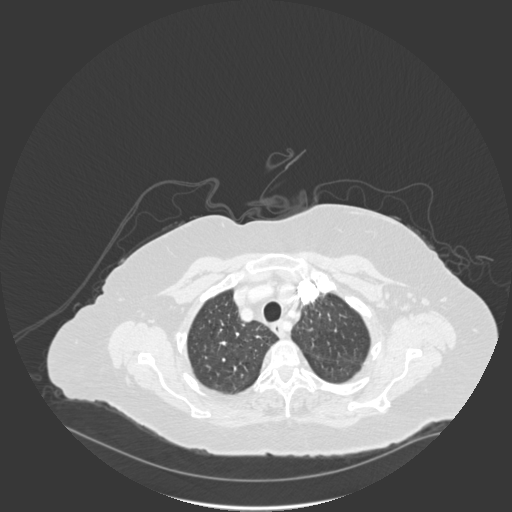
[im 307/346  soft-tissue]
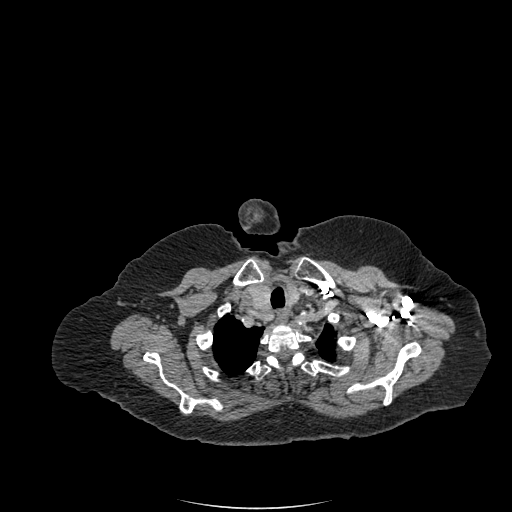
[im 326/346  lung]
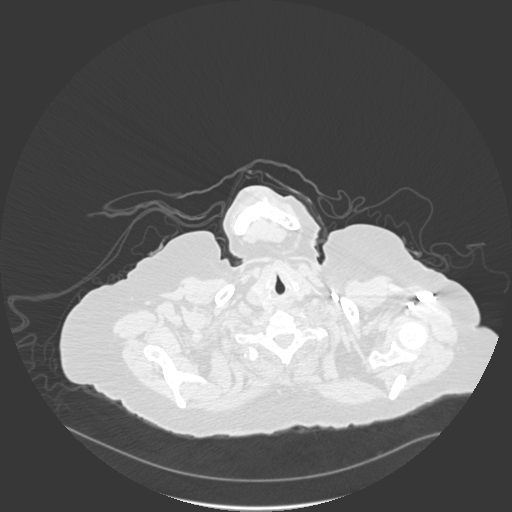

[Series 7: coronal mpr · coronal · 0.76mm/px · 2 of 110 slices shown]
[im 37/110  soft-tissue]
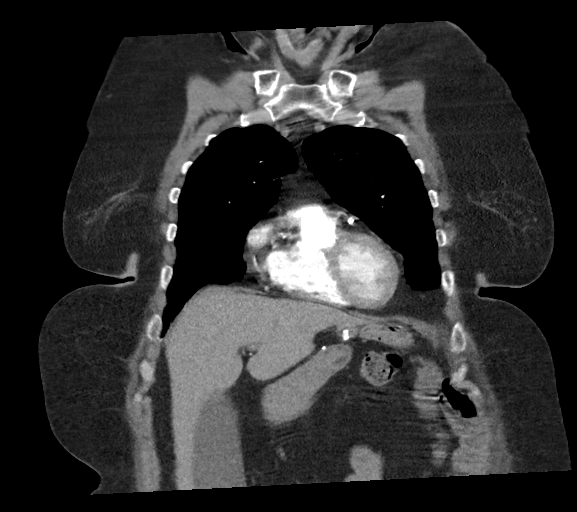
[im 73/110  soft-tissue]
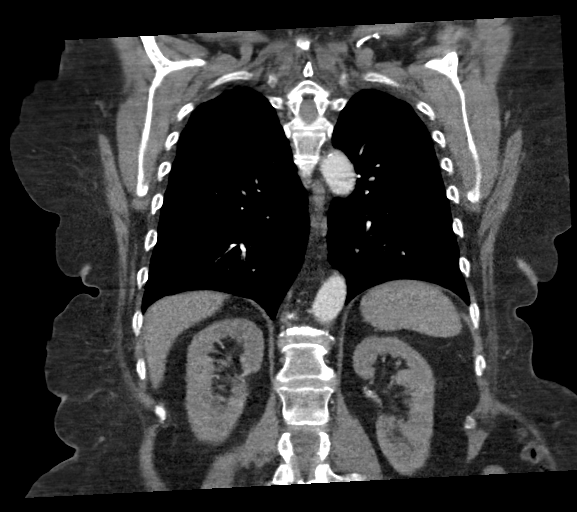

[19 of 46 positions shown; findings below may reference images not displayed]

FINDINGS: Cardiovascular: Respiratory motion is present. Satisfactory
opacification of the pulmonary arteries to the segmental level. No
evidence of pulmonary embolism. Normal heart size. No pericardial
effusion. Coronary artery calcification. Aorta is normal in caliber
with atherosclerotic plaque.

Mediastinum/Nodes: No enlarged lymph nodes identified. Small hiatal
hernia. Thyroid is unremarkable.

Lungs/Pleura: No consolidation. Benign 10 mm right lower lobe
nodule. Minimal atelectasis/scarring at the lung bases. No pleural
effusion. No pneumothorax.

Upper Abdomen: No acute abnormality. Postoperative changes of
bariatric surgery.

Musculoskeletal: No acute osseous abnormality.

Review of the MIP images confirms the above findings.
IMPRESSION: No evidence of acute pulmonary embolism or other acute abnormality.

Aortic Atherosclerosis ([id number].H).

## 2021-03-04 ENCOUNTER — Other Ambulatory Visit: Payer: Self-pay | Admitting: Cardiology

## 2021-05-27 ENCOUNTER — Other Ambulatory Visit: Payer: Self-pay | Admitting: Cardiology

## 2021-08-27 ENCOUNTER — Other Ambulatory Visit: Payer: Self-pay | Admitting: Cardiology

## 2021-10-18 ENCOUNTER — Other Ambulatory Visit: Payer: Self-pay | Admitting: Cardiology

## 2022-10-18 ENCOUNTER — Other Ambulatory Visit: Payer: Self-pay

## 2022-10-18 ENCOUNTER — Encounter (HOSPITAL_COMMUNITY): Payer: Self-pay

## 2022-10-18 ENCOUNTER — Emergency Department (HOSPITAL_COMMUNITY)
Admission: EM | Admit: 2022-10-18 | Discharge: 2022-10-18 | Disposition: A | Discharge status: Home / Self Care | Payer: Medicare Other | Attending: Emergency Medicine | Admitting: Emergency Medicine

## 2022-10-18 ENCOUNTER — Emergency Department (HOSPITAL_COMMUNITY): Payer: Medicare Other

## 2022-10-18 DIAGNOSIS — R072 Precordial pain: Secondary | ICD-10-CM | POA: Diagnosis present

## 2022-10-18 DIAGNOSIS — W1830XA Fall on same level, unspecified, initial encounter: Secondary | ICD-10-CM | POA: Insufficient documentation

## 2022-10-18 DIAGNOSIS — J449 Chronic obstructive pulmonary disease, unspecified: Secondary | ICD-10-CM | POA: Insufficient documentation

## 2022-10-18 DIAGNOSIS — Z79899 Other long term (current) drug therapy: Secondary | ICD-10-CM | POA: Insufficient documentation

## 2022-10-18 DIAGNOSIS — R7989 Other specified abnormal findings of blood chemistry: Secondary | ICD-10-CM | POA: Diagnosis not present

## 2022-10-18 DIAGNOSIS — I6782 Cerebral ischemia: Secondary | ICD-10-CM | POA: Insufficient documentation

## 2022-10-18 DIAGNOSIS — Z7951 Long term (current) use of inhaled steroids: Secondary | ICD-10-CM | POA: Insufficient documentation

## 2022-10-18 DIAGNOSIS — R079 Chest pain, unspecified: Secondary | ICD-10-CM

## 2022-10-18 DIAGNOSIS — Z7982 Long term (current) use of aspirin: Secondary | ICD-10-CM | POA: Diagnosis not present

## 2022-10-18 DIAGNOSIS — S20211A Contusion of right front wall of thorax, initial encounter: Secondary | ICD-10-CM | POA: Diagnosis not present

## 2022-10-18 DIAGNOSIS — I10 Essential (primary) hypertension: Secondary | ICD-10-CM | POA: Insufficient documentation

## 2022-10-18 HISTORY — DX: Unspecified asthma, uncomplicated: J45.909

## 2022-10-18 LAB — BASIC METABOLIC PANEL
Anion gap: 9 (ref 5–15)
BUN: 17 mg/dL (ref 8–23)
CO2: 24 mmol/L (ref 22–32)
Calcium: 8.8 mg/dL — ABNORMAL LOW (ref 8.9–10.3)
Chloride: 106 mmol/L (ref 98–111)
Creatinine, Ser: 0.76 mg/dL (ref 0.44–1.00)
GFR, Estimated: >60 mL/min (ref >60)
Glucose, Bld: 99 mg/dL (ref 70–99)
Potassium: 3.7 mmol/L (ref 3.5–5.1)
Sodium: 139 mmol/L (ref 135–145)

## 2022-10-18 LAB — CBC
HCT: 40.8 % (ref 36.0–46.0)
Hemoglobin: 12.5 g/dL (ref 12.0–15.0)
MCH: 28.2 pg (ref 26.0–34.0)
MCHC: 30.6 g/dL (ref 30.0–36.0)
MCV: 91.9 fL (ref 80.0–100.0)
Platelets: 179 10*3/uL (ref 150–400)
RBC: 4.44 MIL/uL (ref 3.87–5.11)
RDW: 13.1 % (ref 11.5–15.5)
WBC: 5.1 10*3/uL (ref 4.0–10.5)
nRBC: 0 % (ref 0.0–0.2)

## 2022-10-18 LAB — TROPONIN I (HIGH SENSITIVITY)
Troponin I (High Sensitivity): 6 ng/L (ref <18)
Troponin I (High Sensitivity): 6 ng/L (ref <18)

## 2022-10-18 MED ORDER — IOHEXOL 350 MG/ML SOLN
75.0000 mL | Freq: Once | INTRAVENOUS | Status: AC | PRN
  Administered 2022-10-18: 75 mL via INTRAVENOUS

## 2022-10-18 NOTE — ED Notes (Addendum)
Pt ambulated to the bathroom, DOE when ambulating back to the room

## 2022-10-18 NOTE — ED Triage Notes (Signed)
Patient has had left sided chest pain and shortness of breath since this weekend. No radiation. No nausea or vomiting. Went to her primary care and had lab work done and her d dimer was 1220. No history of blood clots. Fall on Sunday but does not remember the fall. Takes baby aspirin daily.

## 2022-10-18 NOTE — ED Provider Notes (Signed)
Carla Russell   CSN: [id number] Arrival date & time: 10/18/22  1739     History  Chief Complaint  Patient presents with   Chest Pain    Carla Russell is a 77 y.o. female.  76 year old female with a history of memory difficulties, hypertension, and asthma/COPD who presents emergency department with chest pain.  States that for the past week has been having substernal dull chest discomfort.  Says it is worse with breathing and position and movement of her upper extremities.  Says that she has some shortness of breath but this is at baseline for her COPD.  Went to her primary doctor and had a D-dimer that was drawn that was significantly elevated and was referred into the emergency department for evaluation of possible PE.  Has a cough at baseline but no changes recently.  No fevers.  No sick contacts.  No diaphoresis or vomiting.  Chest pain is not exertional.  Did have a fall 5 days ago where she was found on the ground by her family.  Patient does not recall exactly how she fell.  Thinks that some of the chest pain may be due to that fall as well and does have a bruise on the right side of her chest from this.  Not on AC.       Home Medications Prior to Admission medications   Medication Sig Start Date End Date Taking? Authorizing Provider  albuterol (VENTOLIN HFA) 108 (90 Base) MCG/ACT inhaler Inhale into the lungs. 05/30/20   [provider]  alendronate (FOSAMAX) 70 MG tablet Take 70 mg by mouth every Saturday.  06/15/19   [provider]  amLODipine (NORVASC) 10 MG tablet Take 10 mg by mouth daily.    [provider]  aspirin EC 81 MG tablet Take 81 mg by mouth daily.    [provider]  b complex vitamins capsule Take 1 capsule by mouth daily.    [provider]  Calcium Carbonate (CALCIUM-CARB 600 PO) Take by mouth.    [provider]  Calcium Citrate-Vitamin D (CALCIUM +  D PO) Take 1 tablet by mouth in the morning and at bedtime.     [provider]  cilostazol (PLETAL) 50 MG tablet TAKE 1 TABLET BY MOUTH TWICE A DAY 05/28/21   Tolia, Sunit, DO  Fluticasone-Umeclidin-Vilant (TRELEGY ELLIPTA) 100-62.5-25 MCG/INH AEPB Inhale 1 puff into the lungs daily. Patient taking differently: Inhale 1 puff into the lungs as needed. 10/27/19   Mannam, Colbert Coyer, MD  olmesartan-hydrochlorothiazide (BENICAR HCT) 20-12.5 MG tablet Take 1 tablet by mouth at bedtime.     [provider]  pravastatin (PRAVACHOL) 20 MG tablet Take 20 mg by mouth daily.    [provider]      Allergies    Patient has no known allergies.    Review of Systems   Review of Systems  Physical Exam Updated Vital Signs BP (!) 145/82   Pulse 89   Temp 98.8 F (37.1 C) (Oral)   Resp 20   Ht 5\' 2"  (1.575 m)   Wt 102.1 kg   SpO2 100%   BMI 41.15 kg/m  Physical Exam Vitals and nursing Russell reviewed.  Constitutional:      General: She is not in acute distress.    Appearance: She is well-developed.  HENT:     Head: Normocephalic and atraumatic.     Right Ear: External ear normal.  Left Ear: External ear normal.     Nose: Nose normal.  Eyes:     Extraocular Movements: Extraocular movements intact.     Conjunctiva/sclera: Conjunctivae normal.     Pupils: Pupils are equal, round, and reactive to light.  Cardiovascular:     Rate and Rhythm: Normal rate and regular rhythm.     Heart sounds: No murmur heard.    Comments: Bruise to R chest wall. Sternal chest discomfort reproducible. Pulmonary:     Effort: Pulmonary effort is normal. No respiratory distress.     Breath sounds: Normal breath sounds.  Musculoskeletal:     Cervical back: Normal range of motion and neck supple.     Right lower leg: No edema.     Left lower leg: No edema.  Skin:    General: Skin is warm and dry.  Neurological:     Mental Status: She is alert and oriented to person, place, and time.  Mental status is at baseline.  Psychiatric:        Mood and Affect: Mood normal.     ED Results / Procedures / Treatments   Labs (all labs ordered are listed, but only abnormal results are displayed) Labs Reviewed  BASIC METABOLIC PANEL - Abnormal; Notable for the following components:      Result Value   Calcium 8.8 (*)    All other components within normal limits  CBC  TROPONIN I (HIGH SENSITIVITY)  TROPONIN I (HIGH SENSITIVITY)    EKG EKG Interpretation  Date/Time:  Friday October 18 2022 20:33:41 EDT Ventricular Rate:  74 PR Interval:  146 QRS Duration: 98 QT Interval:  414 QTC Calculation: 428 R Axis:   59 Text Interpretation: Sinus rhythm Ventricular premature complex Abnormal R-wave progression, early transition Nonspecific T abnormalities, lateral leads Confirmed by Vonita Moss [id number]) on 10/18/2022 9:59:45 PM  Radiology CT Head Wo Contrast  Result Date: 10/18/2022 CLINICAL DATA:  Unwitnessed fall. Left-sided chest pain and shortness of breath. EXAM: CT HEAD WITHOUT CONTRAST TECHNIQUE: Contiguous axial images were obtained from the base of the skull through the vertex without intravenous contrast. RADIATION DOSE REDUCTION: This exam was performed according to the departmental dose-optimization program which includes automated exposure control, adjustment of the mA and/or kV according to patient size and/or use of iterative reconstruction technique. COMPARISON:  None Available. FINDINGS: Brain: No acute intracranial hemorrhage, midline shift or mass effect. No extra-axial fluid collection. Subcortical and periventricular white matter hypodensities are present bilaterally. No hydrocephalus. There is atrophy of the temporal lobe on the left. Vascular: No hyperdense vessel or unexpected calcification. Skull: Normal. Negative for fracture or focal lesion. Sinuses/Orbits: No acute finding. Other: None. IMPRESSION: 1. No acute intracranial process. 2. Chronic microvascular  ischemic changes. Electronically Signed   By: Thornell Sartorius M.D.   On: 10/18/2022 20:27   CT Angio Chest PE W and/or Wo Contrast  Result Date: 10/18/2022 CLINICAL DATA:  Pulmonary embolism suspected, high probability. Left-sided chest pain and shortness of breath. EXAM: CT ANGIOGRAPHY CHEST WITH CONTRAST TECHNIQUE: Multidetector CT imaging of the chest was performed using the standard protocol during bolus administration of intravenous contrast. Multiplanar CT image reconstructions and MIPs were obtained to evaluate the vascular anatomy. RADIATION DOSE REDUCTION: This exam was performed according to the departmental dose-optimization program which includes automated exposure control, adjustment of the mA and/or kV according to patient size and/or use of iterative reconstruction technique. CONTRAST:  75mL OMNIPAQUE IOHEXOL 350 MG/ML SOLN COMPARISON:  05/23/2020. FINDINGS: Cardiovascular: The heart is  enlarged and there is no pericardial effusion. Three-vessel coronary artery calcifications are noted. There is atherosclerotic calcification of the aorta without evidence of aneurysm. The pulmonary trunk is normal in caliber. No definite evidence of pulmonary embolism. Examination is limited due to respiratory motion artifact. Mediastinum/Nodes: A few nonenlarged calcified mediastinal and right hilar lymph nodes are noted. No axillary lymphadenopathy. The thyroid gland, trachea, and esophagus are within normal limits. There is a small hiatal hernia. Lungs/Pleura: 1.2 cm nodule is present in the right lower lobe, axial image 62. There is a 7 mm nodule in the left lower lobe, axial image 34. No effusion or pneumothorax. Upper Abdomen: A stone is present in the gallbladder. Mild thickening of the adrenal glands is noted bilaterally without evidence of discrete nodule. Gastric surgery changes are noted. No acute abnormality Musculoskeletal: Degenerative changes are present in the thoracic spine. No acute osseous  abnormality. Review of the MIP images confirms the above findings. IMPRESSION: 1. No evidence of pulmonary embolism. Evaluation is limited due to respiratory motion artifact. 2. Bilateral lower lobe pulmonary nodules measuring up to 1.2 cm in the right lower lobe. Consider one of the following in 3 months for both low-risk and high-risk individuals: (a) repeat chest CT, (b) follow-up PET-CT, or (c) tissue sampling. This recommendation follows the consensus statement: Guidelines for Management of Incidental Pulmonary Nodules Detected on CT Images: From the Fleischner Society 2017; Radiology 2017; 284:228-243. 3. Coronary artery calcifications. 4. Aortic atherosclerosis. 5. Cholelithiasis. 6. Small hiatal hernia. Electronically Signed   By: Thornell Sartorius M.D.   On: 10/18/2022 20:23   DG Chest 2 View  Result Date: 10/18/2022 CLINICAL DATA:  Chest pain. EXAM: CHEST - 2 VIEW COMPARISON:  Same day. FINDINGS: The heart size and mediastinal contours are within normal limits. Both lungs are clear. The visualized skeletal structures are unremarkable. IMPRESSION: No active cardiopulmonary disease. Electronically Signed   By: Lupita Raider M.D.   On: 10/18/2022 18:37    Procedures Procedures    Medications Ordered in ED Medications  iohexol (OMNIPAQUE) 350 MG/ML injection 75 mL (75 mLs Intravenous Contrast Given 10/18/22 1952)    ED Course/ Medical Decision Making/ A&P                             Medical Decision Making Amount and/or Complexity of Data Reviewed Labs: ordered. Radiology: ordered.  Risk Prescription drug management.   Tatum Corl is a 77 y.o. female with comorbidities that complicate the patient evaluation including memory difficulties, hypertension, asthma and COPD presents emergency department with chest pain in the setting of falling which is reproducible and pleuritic.  Also has had elevated D-dimer as an outpatient.  Initial Ddx:  Broken rib, bruised rib, pulmonary embolism,  MI  MDM/Course:  Feel the patient likely has chest trauma and is causing her chest pain.  Will obtain a CT scan at this time since she has elevated D-dimer which will also evaluate for any broken ribs.  Upon re-evaluation patient is feeling better after the Tylenol.  CT scan did not show any evidence of PE or broken rib.  Was counseled to take Tylenol and use over-the-counter lidocaine patches for her rib pain.  Will have her follow-up with her primary doctor and cardiology regarding her chest discomfort.  This patient presents to the ED for concern of complaints listed in HPI, this involves an extensive number of treatment options, and is a complaint that carries with it  a high risk of complications and morbidity. Disposition including potential need for admission considered.   Dispo: DC Home. Return precautions discussed including, but not limited to, those listed in the AVS. Allowed pt time to ask questions which were answered fully prior to dc.  Additional history obtained from daughter Records reviewed Outpatient Clinic Notes The following labs were independently interpreted: Chemistry and show no acute abnormality I independently reviewed the following imaging with scope of interpretation limited to determining acute life threatening conditions related to emergency care: CT Head and agree with the radiologist interpretation with the following exceptions: none I personally reviewed and interpreted cardiac monitoring: normal sinus rhythm  I personally reviewed and interpreted the pt's EKG: see above for interpretation  I have reviewed the patients home medications and made adjustments as needed Social Determinants of health:  Elderly         Final Clinical Impression(s) / ED Diagnoses Final diagnoses:  Chest pain, unspecified type  Contusion of rib on right side, initial encounter    Rx / DC Orders ED Discharge Orders     None         Rondel Baton, MD 10/18/22  2206

## 2022-10-18 NOTE — Discharge Instructions (Signed)
You were seen for your chest pain in the emergency department.  It is likely that you have a bruised rib.  At home, please take Tylenol and use lidocaine patches we have prescribed you for your pain.   Check your MyChart online for the results of any tests that had not resulted by the time you left the emergency department.   Follow-up with your primary doctor in 2-3 days regarding your visit.  Please call your cardiologist to set up an appointment in 1 week.  Return immediately to the emergency department if you experience any of the following: Difficulty breathing, fever, severe pain, or any other concerning symptoms.    Thank you for visiting our Emergency Department. It was a pleasure taking care of you today.

## 2022-10-18 NOTE — ED Notes (Signed)
Pt removed her IV
